# Patient Record
Sex: Female | Born: 1960
Health system: Southern US, Community
[De-identification: ages and names within clinical notes are randomized; demographics above are authoritative.]

## PROBLEM LIST (undated history)

## (undated) DIAGNOSIS — T7840XA Allergy, unspecified, initial encounter: Secondary | ICD-10-CM

## (undated) DIAGNOSIS — N950 Postmenopausal bleeding: Secondary | ICD-10-CM

## (undated) DIAGNOSIS — E785 Hyperlipidemia, unspecified: Secondary | ICD-10-CM

## (undated) DIAGNOSIS — G43109 Migraine with aura, not intractable, without status migrainosus: Secondary | ICD-10-CM

## (undated) DIAGNOSIS — F32A Depression, unspecified: Secondary | ICD-10-CM

## (undated) HISTORY — PX: INDUCED ABORTION: SHX677

## (undated) HISTORY — DX: Postmenopausal bleeding: N95.0

## (undated) HISTORY — PX: BREAST BIOPSY: SHX20

## (undated) HISTORY — PX: COLONOSCOPY: SHX174

## (undated) HISTORY — DX: Depression, unspecified: F32.A

## (undated) HISTORY — DX: Hyperlipidemia, unspecified: E78.5

## (undated) HISTORY — DX: Migraine with aura, not intractable, without status migrainosus: G43.109

## (undated) HISTORY — DX: Allergy, unspecified, initial encounter: T78.40XA

## (undated) SURGERY — Surgical Case
Anesthesia: *Unknown

---

## 2000-07-01 ENCOUNTER — Other Ambulatory Visit: Admission: RE | Admit: 2000-07-01 | Discharge: 2000-07-01 | Payer: Self-pay | Admitting: *Deleted

## 2001-11-30 ENCOUNTER — Other Ambulatory Visit: Admission: RE | Admit: 2001-11-30 | Discharge: 2001-11-30 | Payer: Self-pay | Admitting: *Deleted

## 2001-11-30 ENCOUNTER — Ambulatory Visit (HOSPITAL_COMMUNITY): Admission: RE | Admit: 2001-11-30 | Discharge: 2001-11-30 | Payer: Self-pay | Admitting: *Deleted

## 2003-11-15 ENCOUNTER — Ambulatory Visit (HOSPITAL_COMMUNITY): Admission: RE | Admit: 2003-11-15 | Discharge: 2003-11-15 | Payer: Self-pay | Admitting: *Deleted

## 2005-02-06 ENCOUNTER — Ambulatory Visit (HOSPITAL_COMMUNITY): Admission: RE | Admit: 2005-02-06 | Discharge: 2005-02-06 | Payer: Self-pay | Admitting: *Deleted

## 2016-03-02 ENCOUNTER — Ambulatory Visit (INDEPENDENT_AMBULATORY_CARE_PROVIDER_SITE_OTHER): Payer: BLUE CROSS/BLUE SHIELD | Admitting: Obstetrics and Gynecology

## 2016-03-02 ENCOUNTER — Encounter: Payer: Self-pay | Admitting: Obstetrics and Gynecology

## 2016-03-02 VITALS — BP 110/70 | HR 64 | Resp 14 | Ht 62.5 in | Wt 117.0 lb

## 2016-03-02 DIAGNOSIS — N950 Postmenopausal bleeding: Secondary | ICD-10-CM | POA: Diagnosis not present

## 2016-03-02 DIAGNOSIS — G43109 Migraine with aura, not intractable, without status migrainosus: Secondary | ICD-10-CM | POA: Insufficient documentation

## 2016-03-02 NOTE — Progress Notes (Signed)
Patient ID: Michelle Watson, female   DOB: Oct 10, 1961, 55 y.o.   MRN: [id number] 55 y.o. G1P0010 MarriedCaucasianF here c/o PMB about two months ago.  Menopause in 2013. 8 months ago she noted one drop of blood in her underwear, not sure where it was from. She placed a tampon in, no blood on the tampon. Then 2 months ago she then had one day of definite vaginal spotting. No pain. No intercourse prior to the bleeding. Normal pap in the fall of 2016. No h/o abnormal paps.  The day prior to bleeding she had breast tenderness.     No LMP recorded. Patient is postmenopausal.          Sexually active: Yes.    The current method of family planning is post menopausal status.    Exercising: Yes.    walking Smoker:  no  Health Maintenance: Pap:  06/2015 WNL(w/ PCP) per Patient History of abnormal Pap:  no MMG:  06/2015 WNL per patient [facility])  Colonoscopy:  08/2012 WNL per patient  BMD:   Never TDaP:  2013 Gardasil: N/A   reports that she has never smoked. She has never used smokeless tobacco. She reports that she drinks about 1.8 oz of alcohol per week. She reports that she does not use illicit drugs. She is a Psychologist, educational, Orthoptist MD  Past Medical History  Diagnosis Date  . Abnormal uterine bleeding   . Migraine     History reviewed. No pertinent past surgical history.  Current Outpatient Prescriptions  Medication Sig Dispense Refill  . triamcinolone (NASACORT ALLERGY 24HR) 55 MCG/ACT AERO nasal inhaler Place 2 sprays into the nose daily.     No current facility-administered medications for this visit.    Family History  Problem Relation Age of Onset  . Hypertension Mother   . Osteoporosis Mother   . Dementia Mother   . Hypertension Father   . CVA Father   . Ovarian cancer Maternal Aunt     Review of Systems  Constitutional: Negative.   HENT: Negative.   Eyes: Negative.   Respiratory: Negative.   Cardiovascular: Negative.   Gastrointestinal: Negative.    Endocrine: Negative.   Genitourinary: Negative.   Musculoskeletal: Negative.   Skin: Negative.   Allergic/Immunologic: Negative.   Neurological: Negative.   Psychiatric/Behavioral: Negative.     Exam:   BP 110/70 mmHg  Pulse 64  Resp 14  Ht 5' 2.5" (1.588 m)  Wt 117 lb (53.071 kg)  BMI 21.05 kg/m2  Weight change: @WEIGHTCHANGE @ Height:   Height: 5' 2.5" (158.8 cm)  Ht Readings from Last 3 Encounters:  03/02/16 5' 2.5" (1.588 m)    General appearance: alert, cooperative and appears stated age Head: Normocephalic, without obvious abnormality, atraumatic Neck: no adenopathy, supple, symmetrical, trachea midline and thyroid normal to inspection and palpation Lungs: clear to auscultation bilaterally Heart: regular rate and rhythm Abdomen: soft, non-tender; bowel sounds normal; no masses,  no organomegaly Extremities: extremities normal, atraumatic, no cyanosis or edema Skin: Skin color, texture, turgor normal. No rashes or lesions Lymph nodes: Cervical, supraclavicular, and axillary nodes normal. No abnormal inguinal nodes palpated Neurologic: Grossly normal   Pelvic: External genitalia:  no lesions              Urethra:  normal appearing urethra with no masses, tenderness or lesions              Bartholins and Skenes: normal  Vagina: normal appearing vagina with normal color and discharge, no lesions              Cervix: no lesions and mild stenosis               Bimanual Exam:  Uterus:  normal size, contour, position, consistency, mobility, non-tender and anteverted              Adnexa: no mass, fullness, tenderness               Rectovaginal: Confirms               Anus:  normal sphincter tone, no lesions  The risks of endometrial biopsy were reviewed and a consent was obtained.  A speculum was placed in the vagina and the cervix was cleansed with betadine. A tenaculum was placed on the cervix. The cervix was mildly stenotic, it was dilated with the 3 mm  dilator and the mini-pipelle was placed into the endometrial cavity. The uterus sounded to 7 cm. The endometrial biopsy was performed, minimal tissue was obtained (definately in the cavity). The tenaculum and speculum were removed. There were no complications.    Chaperone was present for exam.  A:  Postmenopausal bleeding, normal exam  P:   Endometrial biopsy done  Return for an ultrasound and possible sonohysterogram

## 2016-03-02 NOTE — Patient Instructions (Signed)
Postmenopausal Bleeding Postmenopausal bleeding is any bleeding a woman has after she has entered into menopause. Menopause is the end of a woman's fertile years. After menopause, a woman no longer ovulates or has menstrual periods.  Postmenopausal bleeding can be caused by various things. Any type of postmenopausal bleeding, even if it appears to be a typical menstrual period, is concerning. This should be evaluated by your health care provider. Any treatment will depend on the cause of the bleeding. HOME CARE INSTRUCTIONS Monitor your condition for any changes. The following actions may help to alleviate any discomfort you are experiencing:  Avoid the use of tampons and douches as directed by your health care provider.  Change your pads frequently.  Get regular pelvic exams and Pap tests.  Keep all follow-up appointments for diagnostic tests as directed by your health care provider. SEEK MEDICAL CARE IF:   Your bleeding lasts more than 1 week.  You have abdominal pain.  You have bleeding with sexual intercourse. SEEK IMMEDIATE MEDICAL CARE IF:   You have a fever, chills, headache, dizziness, muscle aches, and bleeding.  You have severe pain with bleeding.  You are passing blood clots.  You have bleeding and need more than 1 pad an hour.  You feel faint. MAKE SURE YOU:  Understand these instructions.  Will watch your condition.  Will get help right away if you are not doing well or get worse.   This information is not intended to replace advice given to you by your health care provider. Make sure you discuss any questions you have with your health care provider.   Document Released: 01/20/2006 Document Revised: 08/02/2013 Document Reviewed: 05/11/2013 Elsevier Interactive Patient Education 2016 [place].  Endometrial Biopsy Post-procedure Instructions . Cramping is common.  You may take Ibuprofen, Aleve, or Tylenol for the cramping.  This should resolve within 24  hours.   . You may have a small amount of spotting.  You should wear a mini pad for the next few days. . You may have intercourse in 24 hours. . You need to call the office if you have any pelvic pain, fever, heavy bleeding, or foul smelling vaginal discharge. . Shower or bathe as normal . You will be notified within one week of your biopsy results or we will discuss your results at your follow-up appointment if needed. Marland Kitchen

## 2016-03-03 ENCOUNTER — Telehealth: Payer: Self-pay | Admitting: Obstetrics and Gynecology

## 2016-03-03 NOTE — Telephone Encounter (Signed)
Spoke with pt regarding benefit for sonohysterogram. Patient understood and agreeable. Patient ready to schedule. Patient scheduled 03/09/16 with Dr Talbert Nan. Pt aware of arrival date and time. Pt aware of 72 hours cancellation policy with [id number] fee. No further questions. Ok to close

## 2016-03-10 ENCOUNTER — Other Ambulatory Visit: Payer: BLUE CROSS/BLUE SHIELD | Admitting: Obstetrics and Gynecology

## 2016-03-10 ENCOUNTER — Other Ambulatory Visit: Payer: BLUE CROSS/BLUE SHIELD

## 2016-03-12 ENCOUNTER — Other Ambulatory Visit: Payer: BLUE CROSS/BLUE SHIELD | Admitting: Obstetrics and Gynecology

## 2016-03-12 ENCOUNTER — Other Ambulatory Visit: Payer: BLUE CROSS/BLUE SHIELD

## 2016-03-12 ENCOUNTER — Ambulatory Visit (INDEPENDENT_AMBULATORY_CARE_PROVIDER_SITE_OTHER): Payer: BLUE CROSS/BLUE SHIELD | Admitting: Obstetrics and Gynecology

## 2016-03-12 ENCOUNTER — Encounter: Payer: Self-pay | Admitting: Obstetrics and Gynecology

## 2016-03-12 ENCOUNTER — Other Ambulatory Visit: Payer: Self-pay | Admitting: Obstetrics and Gynecology

## 2016-03-12 ENCOUNTER — Ambulatory Visit (INDEPENDENT_AMBULATORY_CARE_PROVIDER_SITE_OTHER): Payer: BLUE CROSS/BLUE SHIELD

## 2016-03-12 VITALS — BP 118/70 | HR 60 | Resp 14 | Wt 120.0 lb

## 2016-03-12 DIAGNOSIS — N950 Postmenopausal bleeding: Secondary | ICD-10-CM

## 2016-03-12 DIAGNOSIS — N841 Polyp of cervix uteri: Secondary | ICD-10-CM | POA: Diagnosis not present

## 2016-03-12 DIAGNOSIS — N839 Noninflammatory disorder of ovary, fallopian tube and broad ligament, unspecified: Secondary | ICD-10-CM | POA: Diagnosis not present

## 2016-03-12 DIAGNOSIS — N838 Other noninflammatory disorders of ovary, fallopian tube and broad ligament: Secondary | ICD-10-CM

## 2016-03-12 NOTE — Progress Notes (Signed)
Patient ID: Michelle Watson, female   DOB: 1961/01/30, 55 y.o.   MRN: [id number] GYNECOLOGY  VISIT   HPI: 55 y.o.   Married  Caucasian  female   G1P0010 with No LMP recorded. Patient is postmenopausal.   here for a sonohysterogram for PMP bleeding. Atrophic endometrium on biopsy.  GYNECOLOGIC HISTORY: No LMP recorded. Patient is postmenopausal. Contraception:postmenopause Menopausal hormone therapy: none        OB History    Gravida Para Term Preterm AB TAB SAB Ectopic Multiple Living   1    1              Patient Active Problem List   Diagnosis Date Noted  . Postmenopausal bleeding 03/02/2016  . Migraine with aura     Past Medical History  Diagnosis Date  . Postmenopausal bleeding   . Migraine with aura     History reviewed. No pertinent past surgical history.  Current Outpatient Prescriptions  Medication Sig Dispense Refill  . triamcinolone (NASACORT ALLERGY 24HR) 55 MCG/ACT AERO nasal inhaler Place 2 sprays into the nose daily.     No current facility-administered medications for this visit.     ALLERGIES: Tequin  Family History  Problem Relation Age of Onset  . Hypertension Mother   . Osteoporosis Mother   . Dementia Mother   . Hypertension Father   . CVA Father   . Ovarian cancer Maternal Aunt     Social History   Social History  . Marital Status: Married    Spouse Name: N/A  . Number of Children: N/A  . Years of Education: N/A   Occupational History  . Not on file.   Social History Main Topics  . Smoking status: Never Smoker   . Smokeless tobacco: Never Used  . Alcohol Use: 1.8 oz/week    3 Standard drinks or equivalent per week  . Drug Use: No  . Sexual Activity:    Partners: Male   Other Topics Concern  . Not on file   Social History Narrative    Review of Systems  Constitutional: Negative.   HENT: Negative.   Eyes: Negative.   Respiratory: Negative.   Cardiovascular: Negative.   Gastrointestinal: Negative.   Genitourinary:  Negative.   Musculoskeletal: Negative.   Skin: Negative.   Neurological: Negative.   Endo/Heme/Allergies: Negative.   Psychiatric/Behavioral: Negative.     PHYSICAL EXAMINATION:    BP 118/70 mmHg  Pulse 60  Resp 14  Wt 120 lb (54.432 kg)    General appearance: alert, cooperative and appears stated age  Pelvic: External genitalia:  no lesions              Urethra:  normal appearing urethra with no masses, tenderness or lesions              Bartholins and Skenes: normal                 Vagina: normal appearing vagina with normal color and discharge, no lesions              Cervix: no lesions                Sonohysterogram The procedure and risks of the procedure were reviewed with the patient, consent form was signed. A speculum was placed in the vagina and the cervix was cleansed with betadine. The sonohysterogram catheter was inserted into the uterine cavity without difficulty. Saline was infused under direct observation with the ultrasound. No endometrial defects noted, but  a large intracavitary defect was seen in the cervix.The catheter was removed.    Chaperone was present for exam.   Ultrasound images were reviewed with the patient. She has a 4 cm complex right ovarian cyst, looks c/w a dermoid. No free fluid. Left ovary with 1 cm follicle.   ASSESSMENT Postmenopausal bleeding, large intracervical polyp seen on sonohysterogram. Inactive endometrial biopsy Complex right ovarian cyst, most c/w a dermoid Family history of ovarian cancer in a Maternal Aunt    PLAN CA 125 done Discussed the ovarian cyst appears benign, but dermoids can have malignant transformation, also discussed risk of torsion or rupture Recommended hysteroscopy, polypectomy, D&C, laparoscopy with RSO, LS, possible LSO Discussed the risks, including infection, bleeding, damage to nearby organs or vessels.  Will schedule surgery. She will let me know if she wants to keep or remove her left ovary.    An  After Visit Summary was printed and given to the patient.  20 minutes face to face time of which over 50% was spent in counseling.

## 2016-03-13 ENCOUNTER — Telehealth: Payer: Self-pay | Admitting: Emergency Medicine

## 2016-03-13 LAB — CA 125: CA 125: 13 U/mL (ref <35)

## 2016-03-13 NOTE — Telephone Encounter (Signed)
-----   Message from Salvadore Dom, MD sent at 03/13/2016  1:32 PM EDT ----- Please let Dr Rocky Link know that her CA 125 was normal.

## 2016-03-13 NOTE — Telephone Encounter (Signed)
Dr. Rocky Link notified of normal CA-125 level. Will follow up as needed. Routing to provider for final review. Patient agreeable to disposition. Will close encounter.

## 2016-04-15 ENCOUNTER — Ambulatory Visit (INDEPENDENT_AMBULATORY_CARE_PROVIDER_SITE_OTHER): Payer: BLUE CROSS/BLUE SHIELD | Admitting: Obstetrics and Gynecology

## 2016-04-15 ENCOUNTER — Encounter: Payer: Self-pay | Admitting: *Deleted

## 2016-04-15 ENCOUNTER — Encounter: Payer: Self-pay | Admitting: Obstetrics and Gynecology

## 2016-04-15 VITALS — BP 90/60 | HR 64 | Resp 14 | Ht 62.5 in | Wt 120.0 lb

## 2016-04-15 DIAGNOSIS — N83201 Unspecified ovarian cyst, right side: Secondary | ICD-10-CM

## 2016-04-15 DIAGNOSIS — N950 Postmenopausal bleeding: Secondary | ICD-10-CM

## 2016-04-15 MED ORDER — OXYCODONE-ACETAMINOPHEN 5-325 MG PO TABS: 1.0000 | ORAL_TABLET | ORAL | Status: DC | PRN

## 2016-04-15 MED ORDER — IBUPROFEN 800 MG PO TABS: 800.0000 mg | ORAL_TABLET | Freq: Three times a day (TID) | ORAL | Status: DC | PRN

## 2016-04-15 NOTE — Progress Notes (Signed)
GYNECOLOGY  VISIT   HPI: 55 y.o.   Married Caucasian female   G1P0010 with No LMP recorded. Patient is postmenopausal.   here for   Pre op for 05/06/16 LAPAROSCOPY OPERATIVE N/A General RNFA NEEDED RIGHT SALPINGO OOPHORECTOMY POSSIBLE LEFT SALPINGO OOPHORECTOMY Right General  DILATATION & CURETTAGE/HYSTEROSCOPY WITH MYOSURE The patient presented with PMP bleeding, biopsy was inactive, sonohysterogram with large endocervical polyp (not seen on exam), she was also noted to have a 4 cm complex right ovarian cyst C/W a dermoid. CA 125 was 13. She does have a maternal aunt with ovarian cancer. The patient desires BSO.      GYNECOLOGIC HISTORY: No LMP recorded. Patient is postmenopausal. Contraception:Post Menopausal  Menopausal hormone therapy: None        OB History    Gravida Para Term Preterm AB TAB SAB Ectopic Multiple Living   1    1              Patient Active Problem List   Diagnosis Date Noted  . Postmenopausal bleeding 03/02/2016  . Migraine with aura     Past Medical History  Diagnosis Date  . Postmenopausal bleeding   . Migraine with aura     History reviewed. No pertinent past surgical history.  Current Outpatient Prescriptions  Medication Sig Dispense Refill  . naproxen (NAPROSYN) 250 MG tablet Take 500 mg by mouth as needed.    . triamcinolone (NASACORT ALLERGY 24HR) 55 MCG/ACT AERO nasal inhaler Place 2 sprays into the nose daily.     No current facility-administered medications for this visit.     ALLERGIES: Tequin  Family History  Problem Relation Age of Onset  . Hypertension Mother   . Osteoporosis Mother   . Dementia Mother   . Hypertension Father   . CVA Father   . Ovarian cancer Maternal Aunt     Social History   Social History  . Marital Status: Married    Spouse Name: N/A  . Number of Children: N/A  . Years of Education: N/A   Occupational History  . Not on file.   Social History Main Topics  . Smoking status: Never Smoker   .  Smokeless tobacco: Never Used  . Alcohol Use: 1.8 oz/week    3 Standard drinks or equivalent per week  . Drug Use: No  . Sexual Activity:    Partners: Male   Other Topics Concern  . Not on file   Social History Narrative    Review of Systems  Constitutional: Negative.   HENT: Negative.   Eyes: Negative.   Respiratory: Negative.   Cardiovascular: Negative.   Gastrointestinal: Negative.   Genitourinary:       Unscheduled bleeding  Musculoskeletal: Negative.   Skin: Negative.   Neurological: Negative.   Endo/Heme/Allergies: Negative.   Psychiatric/Behavioral: Negative.     PHYSICAL EXAMINATION:    BP 90/60 mmHg  Pulse 64  Resp 14  Ht 5' 2.5" (1.588 m)  Wt 120 lb (54.432 kg)  BMI 21.59 kg/m2    General appearance: alert, cooperative and appears stated age Neck: no adenopathy, supple, symmetrical, trachea midline and thyroid normal to inspection and palpation Heart: regular rate and rhythm Lungs: CTAB Abdomen: soft, non-tender; bowel sounds normal; no masses,  no organomegaly Lungs: CTAB Extremities: normal, atraumatic, no cyanosis Skin: normal color, texture and turgor, no rashes or lesions Lymph: normal cervical supraclavicular and inguinal nodes Neurologic: grossly normal     ASSESSMENT Postmenopausal bleeding, large endocervical polyp on sonohysterogram Complex  right ovarian cyst, c/w dermoid, normal CA 125    PLAN Plan: hysteroscopy, cervical polypectomy, dilation and curettage, laparoscopy, BSO. Reviewed risks, including: bleeding, infection, uterine perforation, fluid overload, need for further sugery, damage to bowel/bladder/vessels/ureters Scripts for percocet and ibuprofen given    An After Visit Summary was printed and given to the patient.

## 2016-04-15 NOTE — H&P (Signed)
Status: Signed       Expand All Collapse All   GYNECOLOGY VISIT  HPI: 55 y.o. Married Caucasian female  G1P0010 with No LMP recorded. Patient is postmenopausal.  here for Pre op for 05/06/16 LAPAROSCOPY OPERATIVE N/AGeneral RNFA NEEDED RIGHT SALPINGO OOPHORECTOMY POSSIBLE LEFT SALPINGO OOPHORECTOMYRightGeneral DILATATION & CURETTAGE/HYSTEROSCOPY WITH MYOSURE The patient presented with PMP bleeding, biopsy was inactive, sonohysterogram with large endocervical polyp (not seen on exam), she was also noted to have a 4 cm complex right ovarian cyst C/W a dermoid. CA 125 was 13. She does have a maternal aunt with ovarian cancer. The patient desires BSO.   GYNECOLOGIC HISTORY: No LMP recorded. Patient is postmenopausal. Contraception:Post Menopausal  Menopausal hormone therapy: None   OB History    Gravida Para Term Preterm AB TAB SAB Ectopic Multiple Living   1    1            Patient Active Problem List   Diagnosis Date Noted  . Postmenopausal bleeding 03/02/2016  . Migraine with aura     Past Medical History  Diagnosis Date  . Postmenopausal bleeding   . Migraine with aura     History reviewed. No pertinent past surgical history.  Current Outpatient Prescriptions  Medication Sig Dispense Refill  . naproxen (NAPROSYN) 250 MG tablet Take 500 mg by mouth as needed.    . triamcinolone (NASACORT ALLERGY 24HR) 55 MCG/ACT AERO nasal inhaler Place 2 sprays into the nose daily.     No current facility-administered medications for this visit.     ALLERGIES: Tequin  Family History  Problem Relation Age of Onset  . Hypertension Mother   . Osteoporosis Mother   . Dementia Mother   . Hypertension Father   . CVA Father   . Ovarian cancer Maternal Aunt     Social History   Social History  . Marital  Status: Married    Spouse Name: N/A  . Number of Children: N/A  . Years of Education: N/A   Occupational History  . Not on file.   Social History Main Topics  . Smoking status: Never Smoker   . Smokeless tobacco: Never Used  . Alcohol Use: 1.8 oz/week    3 Standard drinks or equivalent per week  . Drug Use: No  . Sexual Activity:    Partners: Male   Other Topics Concern  . Not on file   Social History Narrative    Review of Systems  Constitutional: Negative.  HENT: Negative.  Eyes: Negative.  Respiratory: Negative.  Cardiovascular: Negative.  Gastrointestinal: Negative.  Genitourinary:   Unscheduled bleeding  Musculoskeletal: Negative.  Skin: Negative.  Neurological: Negative.  Endo/Heme/Allergies: Negative.  Psychiatric/Behavioral: Negative.    PHYSICAL EXAMINATION:   BP 90/60 mmHg  Pulse 64  Resp 14  Ht 5' 2.5" (1.588 m)  Wt 120 lb (54.432 kg)  BMI 21.59 kg/m2  General appearance: alert, cooperative and appears stated age Neck: no adenopathy, supple, symmetrical, trachea midline and thyroid normal to inspection and palpation Heart: regular rate and rhythm Lungs: CTAB Abdomen: soft, non-tender; bowel sounds normal; no masses, no organomegaly Lungs: CTAB Extremities: normal, atraumatic, no cyanosis Skin: normal color, texture and turgor, no rashes or lesions Lymph: normal cervical supraclavicular and inguinal nodes Neurologic: grossly normal     ASSESSMENT Postmenopausal bleeding, large endocervical polyp on sonohysterogram Complex right ovarian cyst, c/w dermoid, normal CA 125   PLAN Plan: hysteroscopy, cervical polypectomy, dilation and curettage, laparoscopy, BSO. Reviewed risks, including: bleeding, infection, uterine  perforation, fluid overload, need for further sugery, damage to bowel/bladder/vessels/ureters Scripts for percocet and ibuprofen given

## 2016-04-21 NOTE — Patient Instructions (Addendum)
Your procedure is scheduled on:  Wednesday, May 06, 2016  Enter through the [employer] [facility] at: 7:00 AM  Pick up the phone at the desk and dial [phone].  Call this number if you have problems the morning of surgery: [phone].  Remember: Do NOT eat food or drink after:  Midnight Tuesday, May 05, 2016  Take these medicines the morning of surgery with a SIP OF WATER: None  Do NOT wear jewelry (body piercing), metal hair clips/bobby pins, make-up, or nail polish. Do NOT wear lotions, powders, or perfumes.  You may wear deodorant. Do NOT shave for 48 hours prior to surgery. Do NOT bring valuables to the hospital. Contacts, dentures, or bridgework may not be worn into surgery.  Have a responsible adult drive you home and stay with you for 24 hours after your procedure

## 2016-04-22 ENCOUNTER — Encounter (HOSPITAL_COMMUNITY): Payer: Self-pay

## 2016-04-22 ENCOUNTER — Encounter (HOSPITAL_COMMUNITY)
Admission: RE | Admit: 2016-04-22 | Discharge: 2016-04-22 | Disposition: A | Discharge status: Home / Self Care | Payer: BLUE CROSS/BLUE SHIELD | Source: Ambulatory Visit | Attending: Obstetrics and Gynecology | Admitting: Obstetrics and Gynecology

## 2016-04-22 DIAGNOSIS — Z01812 Encounter for preprocedural laboratory examination: Secondary | ICD-10-CM | POA: Insufficient documentation

## 2016-04-22 LAB — CBC
HCT: 38.3 % (ref 36.0–46.0)
Hemoglobin: 12.6 g/dL (ref 12.0–15.0)
MCH: 31.1 pg (ref 26.0–34.0)
MCHC: 32.9 g/dL (ref 30.0–36.0)
MCV: 94.6 fL (ref 78.0–100.0)
PLATELETS: 190 10*3/uL (ref 150–400)
RBC: 4.05 MIL/uL (ref 3.87–5.11)
RDW: 13.3 % (ref 11.5–15.5)
WBC: 7.3 10*3/uL (ref 4.0–10.5)

## 2016-04-22 LAB — COMPREHENSIVE METABOLIC PANEL
ALBUMIN: 4.5 g/dL (ref 3.5–5.0)
ALT: 14 U/L (ref 14–54)
AST: 18 U/L (ref 15–41)
Alkaline Phosphatase: 41 U/L (ref 38–126)
Anion gap: 6 (ref 5–15)
BUN: 33 mg/dL — AB (ref 6–20)
CHLORIDE: 101 mmol/L (ref 101–111)
CO2: 29 mmol/L (ref 22–32)
Calcium: 9.1 mg/dL (ref 8.9–10.3)
Creatinine, Ser: 0.68 mg/dL (ref 0.44–1.00)
GFR calc Af Amer: >60 mL/min (ref >60)
GLUCOSE: 106 mg/dL — AB (ref 65–99)
POTASSIUM: 4 mmol/L (ref 3.5–5.1)
SODIUM: 136 mmol/L (ref 135–145)
Total Bilirubin: 0.2 mg/dL — ABNORMAL LOW (ref 0.3–1.2)
Total Protein: 7.4 g/dL (ref 6.5–8.1)

## 2016-04-22 NOTE — Pre-Procedure Instructions (Signed)
Lamont Snowball made aware of Michelle Watson's elevated BUN level, she stated she will notify Dr. Quincy Simmonds.

## 2016-05-05 NOTE — Anesthesia Preprocedure Evaluation (Addendum)
Anesthesia Evaluation  Patient identified by MRN, date of birth, ID band Patient awake    Reviewed: Allergy & Precautions, NPO status , Patient's Chart, lab work & pertinent test results  Airway Mallampati: II  TM Distance: >3 FB Neck ROM: Full    Dental  (+) Teeth Intact, Dental Advisory Given   Pulmonary neg pulmonary ROS,    Pulmonary exam normal breath sounds clear to auscultation       Cardiovascular negative cardio ROS Normal cardiovascular exam Rhythm:Regular Rate:Normal     Neuro/Psych  Headaches, negative psych ROS   GI/Hepatic negative GI ROS, Neg liver ROS,   Endo/Other  negative endocrine ROS  Renal/GU negative Renal ROS     Musculoskeletal negative musculoskeletal ROS (+)   Abdominal   Peds  Hematology negative hematology ROS (+)   Anesthesia Other Findings Day of surgery medications reviewed with the patient.  Reproductive/Obstetrics PMB                             Anesthesia Physical Anesthesia Plan  ASA: II  Anesthesia Plan: General   Post-op Pain Management:    Induction: Intravenous  Airway Management Planned: Oral ETT  Additional Equipment:   Intra-op Plan:   Post-operative Plan: Extubation in OR  Informed Consent: I have reviewed the patients History and Physical, chart, labs and discussed the procedure including the risks, benefits and alternatives for the proposed anesthesia with the patient or authorized representative who has indicated his/her understanding and acceptance.   Dental advisory given  Plan Discussed with: CRNA  Anesthesia Plan Comments: (Risks/benefits of general anesthesia discussed with patient including risk of damage to teeth, lips, gum, and tongue, nausea/vomiting, allergic reactions to medications, and the possibility of heart attack, stroke and death.  All patient questions answered.  Patient wishes to proceed.)         Anesthesia Quick Evaluation

## 2016-05-06 ENCOUNTER — Ambulatory Visit (HOSPITAL_COMMUNITY): Payer: BLUE CROSS/BLUE SHIELD | Admitting: Anesthesiology

## 2016-05-06 ENCOUNTER — Encounter (HOSPITAL_COMMUNITY): Payer: Self-pay | Admitting: *Deleted

## 2016-05-06 ENCOUNTER — Ambulatory Visit (HOSPITAL_COMMUNITY)
Admission: RE | Admit: 2016-05-06 | Discharge: 2016-05-06 | Disposition: A | Discharge status: Home / Self Care | Payer: BLUE CROSS/BLUE SHIELD | Source: Ambulatory Visit | Attending: Obstetrics and Gynecology | Admitting: Obstetrics and Gynecology

## 2016-05-06 ENCOUNTER — Encounter (HOSPITAL_COMMUNITY)
Admission: RE | Disposition: A | Discharge status: Home / Self Care | Payer: Self-pay | Source: Ambulatory Visit | Attending: Obstetrics and Gynecology

## 2016-05-06 DIAGNOSIS — N841 Polyp of cervix uteri: Secondary | ICD-10-CM | POA: Insufficient documentation

## 2016-05-06 DIAGNOSIS — N83201 Unspecified ovarian cyst, right side: Secondary | ICD-10-CM | POA: Diagnosis not present

## 2016-05-06 DIAGNOSIS — N840 Polyp of corpus uteri: Secondary | ICD-10-CM | POA: Insufficient documentation

## 2016-05-06 DIAGNOSIS — Z823 Family history of stroke: Secondary | ICD-10-CM | POA: Insufficient documentation

## 2016-05-06 DIAGNOSIS — D270 Benign neoplasm of right ovary: Secondary | ICD-10-CM | POA: Insufficient documentation

## 2016-05-06 DIAGNOSIS — N950 Postmenopausal bleeding: Secondary | ICD-10-CM | POA: Diagnosis not present

## 2016-05-06 DIAGNOSIS — Z8041 Family history of malignant neoplasm of ovary: Secondary | ICD-10-CM | POA: Diagnosis not present

## 2016-05-06 HISTORY — PX: LAPAROSCOPY: SHX197

## 2016-05-06 HISTORY — PX: SALPINGOOPHORECTOMY: SHX82

## 2016-05-06 HISTORY — PX: DILATATION & CURETTAGE/HYSTEROSCOPY WITH MYOSURE: SHX6511

## 2016-05-06 SURGERY — LAPAROSCOPY OPERATIVE
Anesthesia: General

## 2016-05-06 MED ORDER — MIDAZOLAM HCL 5 MG/5ML IJ SOLN
INTRAMUSCULAR | Status: DC | PRN
  Administered 2016-05-06: 2 mg via INTRAVENOUS

## 2016-05-06 MED ORDER — KETOROLAC TROMETHAMINE 30 MG/ML IJ SOLN
INTRAMUSCULAR | Status: DC | PRN
  Administered 2016-05-06: 30 mg via INTRAVENOUS

## 2016-05-06 MED ORDER — PROMETHAZINE HCL 25 MG/ML IJ SOLN: 6.2500 mg | INTRAMUSCULAR | Status: DC | PRN

## 2016-05-06 MED ORDER — PROPOFOL 10 MG/ML IV BOLUS
INTRAVENOUS | Status: DC | PRN
  Administered 2016-05-06: 60 mg via INTRAVENOUS

## 2016-05-06 MED ORDER — ONDANSETRON HCL 4 MG/2ML IJ SOLN
INTRAMUSCULAR | Status: DC | PRN
  Administered 2016-05-06: 4 mg via INTRAVENOUS

## 2016-05-06 MED ORDER — FENTANYL CITRATE (PF) 100 MCG/2ML IJ SOLN: 25.0000 ug | INTRAMUSCULAR | Status: DC | PRN

## 2016-05-06 MED ORDER — LACTATED RINGERS IV SOLN
INTRAVENOUS | Status: DC
  Administered 2016-05-06 (×2): via INTRAVENOUS

## 2016-05-06 MED ORDER — SODIUM CHLORIDE 0.9 % IR SOLN
Status: DC | PRN
  Administered 2016-05-06: 3000 mL

## 2016-05-06 MED ORDER — DEXAMETHASONE SODIUM PHOSPHATE 4 MG/ML IJ SOLN
INTRAMUSCULAR | Status: DC | PRN
  Administered 2016-05-06: 4 mg via INTRAVENOUS

## 2016-05-06 MED ORDER — FENTANYL CITRATE (PF) 250 MCG/5ML IJ SOLN
INTRAMUSCULAR | Status: AC
  Filled 2016-05-06: qty 5

## 2016-05-06 MED ORDER — BUPIVACAINE HCL (PF) 0.25 % IJ SOLN
INTRAMUSCULAR | Status: DC | PRN
  Administered 2016-05-06: 8 mL

## 2016-05-06 MED ORDER — SUGAMMADEX SODIUM 200 MG/2ML IV SOLN
INTRAVENOUS | Status: DC | PRN
  Administered 2016-05-06: 109.2 mg via INTRAVENOUS

## 2016-05-06 MED ORDER — LIDOCAINE HCL (PF) 1 % IJ SOLN
INTRAMUSCULAR | Status: AC
  Filled 2016-05-06: qty 5

## 2016-05-06 MED ORDER — LIDOCAINE HCL (CARDIAC) 20 MG/ML IV SOLN
INTRAVENOUS | Status: DC | PRN
  Administered 2016-05-06: 50 mg via INTRAVENOUS

## 2016-05-06 MED ORDER — FENTANYL CITRATE (PF) 100 MCG/2ML IJ SOLN
INTRAMUSCULAR | Status: DC | PRN
  Administered 2016-05-06: 50 ug via INTRAVENOUS
  Administered 2016-05-06: 100 ug via INTRAVENOUS

## 2016-05-06 MED ORDER — GLYCOPYRROLATE 0.2 MG/ML IJ SOLN
INTRAMUSCULAR | Status: AC
  Filled 2016-05-06: qty 3

## 2016-05-06 MED ORDER — NEOSTIGMINE METHYLSULFATE 10 MG/10ML IV SOLN
INTRAVENOUS | Status: AC
  Filled 2016-05-06: qty 1

## 2016-05-06 MED ORDER — PROPOFOL 10 MG/ML IV BOLUS
INTRAVENOUS | Status: AC
  Filled 2016-05-06: qty 20

## 2016-05-06 MED ORDER — ONDANSETRON HCL 4 MG/2ML IJ SOLN
INTRAMUSCULAR | Status: AC
  Filled 2016-05-06: qty 2

## 2016-05-06 MED ORDER — ROCURONIUM BROMIDE 100 MG/10ML IV SOLN
INTRAVENOUS | Status: DC | PRN
  Administered 2016-05-06: 40 mg via INTRAVENOUS
  Administered 2016-05-06: 10 mg via INTRAVENOUS

## 2016-05-06 MED ORDER — SCOPOLAMINE 1 MG/3DAYS TD PT72
1.0000 | MEDICATED_PATCH | Freq: Once | TRANSDERMAL | Status: DC
Start: 2016-05-06 — End: 2016-05-06
  Administered 2016-05-06: 1.5 mg via TRANSDERMAL

## 2016-05-06 MED ORDER — VASOPRESSIN 20 UNIT/ML IV SOLN
INTRAVENOUS | Status: AC
  Filled 2016-05-06: qty 1

## 2016-05-06 MED ORDER — HEPARIN SODIUM (PORCINE) 5000 UNIT/ML IJ SOLN
INTRAMUSCULAR | Status: DC | PRN
  Administered 2016-05-06: 5000 [IU]

## 2016-05-06 MED ORDER — HEPARIN SODIUM (PORCINE) 5000 UNIT/ML IJ SOLN
INTRAMUSCULAR | Status: AC
  Filled 2016-05-06: qty 1

## 2016-05-06 MED ORDER — LACTATED RINGERS IV SOLN: INTRAVENOUS | Status: DC

## 2016-05-06 MED ORDER — LACTATED RINGERS IR SOLN
Status: DC | PRN
  Administered 2016-05-06: 3000 mL

## 2016-05-06 MED ORDER — SODIUM CHLORIDE 0.9 % IJ SOLN
INTRAMUSCULAR | Status: AC
  Filled 2016-05-06: qty 100

## 2016-05-06 MED ORDER — BUPIVACAINE HCL (PF) 0.25 % IJ SOLN
INTRAMUSCULAR | Status: AC
  Filled 2016-05-06: qty 30

## 2016-05-06 MED ORDER — KETOROLAC TROMETHAMINE 30 MG/ML IJ SOLN
INTRAMUSCULAR | Status: AC
  Filled 2016-05-06: qty 1

## 2016-05-06 MED ORDER — SCOPOLAMINE 1 MG/3DAYS TD PT72
MEDICATED_PATCH | TRANSDERMAL | Status: AC
  Administered 2016-05-06: 1.5 mg via TRANSDERMAL
  Filled 2016-05-06: qty 1

## 2016-05-06 MED ORDER — MIDAZOLAM HCL 2 MG/2ML IJ SOLN
INTRAMUSCULAR | Status: AC
  Filled 2016-05-06: qty 2

## 2016-05-06 MED ORDER — DEXAMETHASONE SODIUM PHOSPHATE 4 MG/ML IJ SOLN
INTRAMUSCULAR | Status: AC
  Filled 2016-05-06: qty 1

## 2016-05-06 SURGICAL SUPPLY — 49 items
APL SRG 38 LTWT LNG FL B (MISCELLANEOUS)
APPLICATOR ARISTA FLEXITIP XL (MISCELLANEOUS) IMPLANT
BAG SPEC RTRVL LRG 6X4 10 (ENDOMECHANICALS) ×2
CABLE HIGH FREQUENCY MONO STRZ (ELECTRODE) IMPLANT
CANISTER SUCT 3000ML (MISCELLANEOUS) ×6 IMPLANT
CATH ROBINSON RED A/P 16FR (CATHETERS) ×2 IMPLANT
CLOTH BEACON ORANGE TIMEOUT ST (SAFETY) ×4 IMPLANT
CONTAINER PREFILL 10% NBF 60ML (FORM) ×8 IMPLANT
DEVICE MYOSURE LITE (MISCELLANEOUS) ×2 IMPLANT
DEVICE MYOSURE REACH (MISCELLANEOUS) IMPLANT
DRSG COVADERM PLUS 2X2 (GAUZE/BANDAGES/DRESSINGS) ×8 IMPLANT
DRSG OPSITE POSTOP 3X4 (GAUZE/BANDAGES/DRESSINGS) IMPLANT
DURAPREP 26ML APPLICATOR (WOUND CARE) ×4 IMPLANT
FILTER ARTHROSCOPY CONVERTOR (FILTER) ×4 IMPLANT
GLOVE BIO SURGEON STRL SZ 6.5 (GLOVE) ×6 IMPLANT
GLOVE BIO SURGEONS STRL SZ 6.5 (GLOVE) ×2
GLOVE BIOGEL PI IND STRL 7.0 (GLOVE) ×4 IMPLANT
GLOVE BIOGEL PI INDICATOR 7.0 (GLOVE) ×4
GOWN STRL REUS W/TWL LRG LVL3 (GOWN DISPOSABLE) ×8 IMPLANT
HEMOSTAT ARISTA ABSORB 3G PWDR (MISCELLANEOUS) IMPLANT
LIGASURE BLUNT 5MM 37CM (INSTRUMENTS) ×2 IMPLANT
LIQUID BAND (GAUZE/BANDAGES/DRESSINGS) ×2 IMPLANT
NEEDLE INSUFFLATION 120MM (ENDOMECHANICALS) ×4 IMPLANT
NS IRRIG 1000ML POUR BTL (IV SOLUTION) ×4 IMPLANT
PACK LAPAROSCOPY BASIN (CUSTOM PROCEDURE TRAY) ×4 IMPLANT
PACK VAGINAL MINOR WOMEN LF (CUSTOM PROCEDURE TRAY) ×4 IMPLANT
PAD OB MATERNITY 4.3X12.25 (PERSONAL CARE ITEMS) ×4 IMPLANT
PAD TRENDELENBURG POSITION (MISCELLANEOUS) ×4 IMPLANT
POUCH LAPAROSCOPIC INSTRUMENT (MISCELLANEOUS) ×4 IMPLANT
POUCH SPECIMEN RETRIEVAL 10MM (ENDOMECHANICALS) ×2 IMPLANT
SCISSORS LAP 5X35 DISP (ENDOMECHANICALS) IMPLANT
SEAL ROD LENS SCOPE MYOSURE (ABLATOR) ×4 IMPLANT
SET CYSTO W/LG BORE CLAMP LF (SET/KITS/TRAYS/PACK) IMPLANT
SET IRRIG TUBING LAPAROSCOPIC (IRRIGATION / IRRIGATOR) IMPLANT
SHEARS HARMONIC ACE PLUS 36CM (ENDOMECHANICALS) IMPLANT
SLEEVE XCEL OPT CAN 5 100 (ENDOMECHANICALS) ×4 IMPLANT
SUT VICRYL 0 UR6 27IN ABS (SUTURE) ×2 IMPLANT
SUT VICRYL 4-0 PS2 18IN ABS (SUTURE) ×4 IMPLANT
SYR 20CC LL (SYRINGE) IMPLANT
SYRINGE 60CC LL (MISCELLANEOUS) ×2 IMPLANT
SYSTEM CARTER THOMASON II (TROCAR) ×2 IMPLANT
TOWEL OR 17X24 6PK STRL BLUE (TOWEL DISPOSABLE) ×8 IMPLANT
TRAY FOLEY CATH SILVER 14FR (SET/KITS/TRAYS/PACK) ×4 IMPLANT
TROCAR XCEL DIL TIP R 11M (ENDOMECHANICALS) ×2 IMPLANT
TROCAR XCEL NON-BLD 5MMX100MML (ENDOMECHANICALS) ×4 IMPLANT
TUBING AQUILEX INFLOW (TUBING) ×4 IMPLANT
TUBING AQUILEX OUTFLOW (TUBING) ×4 IMPLANT
WARMER LAPAROSCOPE (MISCELLANEOUS) ×4 IMPLANT
WATER STERILE IRR 1000ML POUR (IV SOLUTION) ×2 IMPLANT

## 2016-05-06 NOTE — Transfer of Care (Signed)
Immediate Anesthesia Transfer of Care Note  Patient: Michelle Watson  Procedure(s) Performed: Procedure(s) with comments: LAPAROSCOPY OPERATIVE (N/A) - RNFA NEEDED  Bilateral Salpingo oophorectomy (Bilateral) DILATATION & CURETTAGE/HYSTEROSCOPY WITH MYOSURE (N/A)  Patient Location: PACU  Anesthesia Type:General  Level of Consciousness: awake, alert  and oriented  Airway & Oxygen Therapy: Patient Spontanous Breathing and Patient connected to nasal cannula oxygen  Post-op Assessment: Report given to RN and Post -op Vital signs reviewed and stable  Post vital signs: Reviewed and stable  Last Vitals:  Filed Vitals:   05/06/16 0726  BP: 112/70  Pulse: 59  Temp: 37 C  Resp: 16    Last Pain: There were no vitals filed for this visit.    Patients Stated Pain Goal: 4 ([id number])  Complications: No apparent anesthesia complications

## 2016-05-06 NOTE — Anesthesia Postprocedure Evaluation (Signed)
Anesthesia Post Note  Patient: Michelle Watson  Procedure(s) Performed: Procedure(s) (LRB): LAPAROSCOPY OPERATIVE (N/A) Bilateral Salpingo oophorectomy (Bilateral) DILATATION & CURETTAGE/HYSTEROSCOPY WITH MYOSURE (N/A)  Patient location during evaluation: PACU Anesthesia Type: General Level of consciousness: awake and alert and oriented Pain management: pain level controlled Vital Signs Assessment: post-procedure vital signs reviewed and stable Respiratory status: spontaneous breathing, nonlabored ventilation and respiratory function stable Cardiovascular status: blood pressure returned to baseline and stable Postop Assessment: no signs of nausea or vomiting Anesthetic complications: no     Last Vitals:  Filed Vitals:   05/06/16 1115 05/06/16 1130  BP: 111/71 120/69  Pulse: 59   Temp:    Resp: 16 16    Last Pain: There were no vitals filed for this visit. Pain Goal: Patients Stated Pain Goal: 4 (05/06/16 0726)               Ismael Treptow A.

## 2016-05-06 NOTE — Anesthesia Procedure Notes (Signed)
Procedure Name: Intubation Date/Time: 05/06/2016 8:34 AM Performed by: Bufford Spikes Pre-anesthesia Checklist: Patient identified, Patient being monitored, Timeout performed, Emergency Drugs available and Suction available Patient Re-evaluated:Patient Re-evaluated prior to inductionOxygen Delivery Method: Circle system utilized Preoxygenation: Pre-oxygenation with 100% oxygen Intubation Type: IV induction Ventilation: Mask ventilation without difficulty Laryngoscope Size: Miller and 2 Grade View: Grade I Tube type: Oral Tube size: 7.0 mm Number of attempts: 1 Airway Equipment and Method: Stylet Placement Confirmation: ETT inserted through vocal cords under direct vision,  positive ETCO2 and breath sounds checked- equal and bilateral Secured at: 21 cm Tube secured with: Tape Dental Injury: Teeth and Oropharynx as per pre-operative assessment

## 2016-05-06 NOTE — Op Note (Signed)
Preoperative Diagnosis: postmenopausal bleeding, endocervical polyp, right ovarian cyst  Postoperative Diagnosis: postmenopausal bleeding, endocervical polyps, right ovarian dermoid  Procedure:  Laparoscopic bilateral salpingo-oophorectomy, hysteroscopy, polypectomy, dilation and curettage  Surgeon: Dr Sumner Boast  Assistant: Trellis Moment, RN, surgical assist  Anesthesia: General  EBL: 5 cc  Fluids: 1,100 cc  Urine output: 400 cc  Fluid deficit: 123XX123 cc  Complications: None  Indications for surgery: The patient is a 55 year old female, who presented with postmenopausal bleeding. Work up included an atrophic endometrial biopsy, a large endocervical polyp on sonohysterogram and a complex right ovarian cyst that looked c/w a dermoid. She had a normal CA 125.  The patient is aware of the risks and complications involved with the surgery and consent was obtained prior to the procedure.  Findings: EUA: normal sized uterus, 4 cm right adnexal mass. Laparoscopy revealed normal ureters bilaterally, normal uterus,normal left adnexa, normal right tube and an enlarged freely mobile right ovary. She had a normal liver edge and appendix. Hysteroscopy revealed atrophic endometrium, normal tubal ostia bilaterally. She had one large and one small endocervical polyp, both up inside of her cervix.   Procedure: The patient was taken to the operating room with an IV in placed. She was placed in the dorsal lithotomy position. General anesthesia was administered. She was prepped and draped in the usual sterile fashion for an abdominal, vaginal surgery. A single tooth tenaculum was placed on the anterior cervix, a hulka tenaculum was too angled for her cavity, so a sound was placed in the uterine cavity and taped to the single tooth tenaculum with steri strips for uterine manipulation. A foley catheter was placed.   The umbilicus was everted, injected with 0.25% marcaine and incised with a # 11 blade. 2 towel  clips were used to elevated the umbilicus and a veress needle was placed into the abdominal cavity. The abdominal cavity was insufflated with CO2, with normal intraabdominal pressures. After adequate pneumo-insufflation the veress needle was removed and the 5 mm laparoscope was placed into the abdominal cavity using the opti-view trocar. The patient was placed in trendelenburg and the abdominal pelvic cavity was inspected. 2 more trocars were placed. 1 in the left  lower quadrant approximately 3 cm medial to the anterior superior iliac spine, and one approximately 5 cm superior to the pubic symphysis in the midline. These areas were injected with 0.25% marcaine, incised with a #11 blades. A 5 mm trocar was inserted in the left lower quadrant and an 11 mm trocar in the midline. Both were inserted with direct visualization with the laparoscope. The abdominal pelvic cavity was again inspected. The ureters were identified bilaterally. Pelvic washings were obtained.   The left infundibulopelvic ligament was cauterized and incised with the ligasure device. The mesosalpinx and meso ovarium were cauterized and cut with the ligasure device. The uterine ovarian ligament and the attachment of the tube to the ovary were both cauterized and cut with the ligasure device. The same procedure was repeated on the right. The laparoscopic bag was placed through the 11 mm port and both adnexa were placed in the bag. The 11 mm trocar was removed and the bag was brought up to the skin incision. The adnexa were both removed through the bag without bag rupture. On removal of the right adnexa, the dermoid was opened, it had the typical sebaceous material and hair found in a dermoid tumor. No leakage occurred into the abdominal cavity.   Pressure was released and hemostasis was  excellent.   The [employer] device was used to place a stich of 0-Vicryl through the fascia of the 11 mm port. The abdominal cavity was desufflated and the  trocars were removed. The skin was closed with subcuticular stiches of 4-0 vicryl and dermabond was placed over the incisions.  Attention was then turned to the vaginal portion of the surgery. A weighted speculum was placed in the vagina and the uterine sound was removed. The single toothed tenaculum was kept in place. The cervix was dilated to a # 7 hagar dilator. The uterus was sounded to 7 cm. The myosure hysteroscope was inserted into the uterine cavity. With continuous infusion of normal saline, the uterine cavity was visualized with the above findings. The myosure light was used to remove both of the cervical polyps. The myosure was then removed. An endocervical curettage was performed. The cavity was then curetted with the small sharp curette. The cavity had the characteristically gritty texture at the end of the procedure. The curette and the single tooth tenaculum were removed. Oozing from the tenaculum site was stopped with pressure. The speculum was removed. The foley catheter was removed. The patients perineum was cleansed of betadine and she was taken out of the dorsal lithotomy position.   Upon awakening she was extubated and transferred to the recovery room in stable and awake condition.  The sponge and instrument count were correct.  There were no complications.

## 2016-05-06 NOTE — Interval H&P Note (Signed)
History and Physical Interval Note:  05/06/2016 8:15 AM  Michelle Watson  has presented today for surgery, with the diagnosis of ovarian cyst, endometrial polyp   The various methods of treatment have been discussed with the patient and family. After consideration of risks, benefits and other options for treatment, the patient has consented to  Procedure(s) with comments: LAPAROSCOPY OPERATIVE (N/A) - RNFA NEEDED  RIGHT SALPINGO OOPHORECTOMY POSSIBLE LEFT SALPINGO OOPHORECTOMY (Right) [place] (N/A) as a surgical intervention .  The patient's history has been reviewed, patient examined, no change in status, stable for surgery.  I have reviewed the patient's chart and labs.  Questions were answered to the patient's satisfaction.     Salvadore Dom

## 2016-05-06 NOTE — Discharge Instructions (Signed)
Post Anesthesia Home Care Instructions  Activity: Get plenty of rest for the remainder of the day. A responsible adult should stay with you for 24 hours following the procedure.  For the next 24 hours, DO NOT: -Drive a car -Paediatric nurse -Drink alcoholic beverages -Take any medication unless instructed by your physician -Make any legal decisions or sign important papers.  Meals: Start with liquid foods such as gelatin or soup. Progress to regular foods as tolerated. Avoid greasy, spicy, heavy foods. If nausea and/or vomiting occur, drink only clear liquids until the nausea and/or vomiting subsides. Call your physician if vomiting continues.  Special Instructions/Symptoms: Your throat may feel dry or sore from the anesthesia or the breathing tube placed in your throat during surgery. If this causes discomfort, gargle with warm salt water. The discomfort should disappear within 24 hours.  If you had a scopolamine patch placed behind your ear for the management of post- operative nausea and/or vomiting:  1. The medication in the patch is effective for 72 hours, after which it should be removed.  Wrap patch in a tissue and discard in the trash. Wash hands thoroughly with soap and water. 2. You may remove the patch earlier than 72 hours if you experience unpleasant side effects which may include dry mouth, dizziness or visual disturbances. 3. Avoid touching the patch. Wash your hands with soap and water after contact with the patch.   DISCHARGE INSTRUCTIONS: D&C / D&E The following instructions have been prepared to help you care for yourself upon your return home.   Personal hygiene:  Use sanitary pads for vaginal drainage, not tampons.  Shower the day after your procedure.  NO tub baths, pools or Jacuzzis for 2-3 weeks.  Wipe front to back after using the bathroom.  Activity and limitations:  Do NOT drive or operate any equipment for 24 hours. The effects of anesthesia are  still present and drowsiness may result.  Do NOT rest in bed all day.  Walking is encouraged.  Walk up and down stairs slowly.  You may resume your normal activity in one to two days or as indicated by your physician.  Sexual activity: NO intercourse for at least 2 weeks after the procedure, or as indicated by your physician.  Diet: Eat a light meal as desired this evening. You may resume your usual diet tomorrow.  Return to work: You may resume your work activities in one to two days or as indicated by your doctor.  What to expect after your surgery: Expect to have vaginal bleeding/discharge for 2-3 days and spotting for up to 10 days. It is not unusual to have soreness for up to 1-2 weeks. You may have a slight burning sensation when you urinate for the first day. Mild cramps may continue for a couple of days. You may have a regular period in 2-6 weeks.  Call your doctor for any of the following:  Excessive vaginal bleeding, saturating and changing one pad every hour.  Inability to urinate 6 hours after discharge from hospital.  Pain not relieved by pain medication.  Fever of 100.4 F or greater.  Unusual vaginal discharge or odor.   Call for an appointment:    Patients signature: ______________________  Nurses signature ________________________  Support person's signature_______________________   DISCHARGE INSTRUCTIONS: Laparoscopy  The following instructions have been prepared to help you care for yourself upon your return home today.  Wound care:  Do not get the incision wet for the first 24 hours. The  incision should be kept clean and dry.  The Band-Aids or dressings may be removed the day after surgery.  Should the incision become sore, red, and swollen after the first week, check with your doctor.  Personal hygiene:  Shower the day after your procedure.  Activity and limitations:  Do NOT drive or operate any equipment today.  Do NOT lift anything  more than 15 pounds for 2-3 weeks after surgery.  Do NOT rest in bed all day.  Walking is encouraged. Walk each day, starting slowly with 5-minute walks 3 or 4 times a day. Slowly increase the length of your walks.  Walk up and down stairs slowly.  Do NOT do strenuous activities, such as golfing, playing tennis, bowling, running, biking, weight lifting, gardening, mowing, or vacuuming for 2-4 weeks. Ask your doctor when it is okay to start.  Diet: Eat a light meal as desired this evening. You may resume your usual diet tomorrow.  Return to work: This is dependent on the type of work you do. For the most part you can return to a desk job within a week of surgery. If you are more active at work, please discuss this with your doctor.  What to expect after your surgery: You may have a slight burning sensation when you urinate on the first day. You may have a very small amount of blood in the urine. Expect to have a small amount of vaginal discharge/light bleeding for 1-2 weeks. It is not unusual to have abdominal soreness and bruising for up to 2 weeks. You may be tired and need more rest for about 1 week. You may experience shoulder pain for 24-72 hours. Lying flat in bed may relieve it.  Call your doctor for any of the following:  Develop a fever of 100.4 or greater  Inability to urinate 6 hours after discharge from hospital  Severe pain not relieved by pain medications  Persistent of heavy bleeding at incision site  Redness or swelling around incision site after a week  Increasing nausea or vomiting  Patient Signature________________________________________ Nurse Signature_________________________________________

## 2016-05-07 ENCOUNTER — Encounter (HOSPITAL_COMMUNITY): Payer: Self-pay | Admitting: Obstetrics and Gynecology

## 2016-05-20 ENCOUNTER — Ambulatory Visit (INDEPENDENT_AMBULATORY_CARE_PROVIDER_SITE_OTHER): Payer: BLUE CROSS/BLUE SHIELD | Admitting: Obstetrics and Gynecology

## 2016-05-20 ENCOUNTER — Encounter: Payer: Self-pay | Admitting: Obstetrics and Gynecology

## 2016-05-20 VITALS — BP 108/60 | HR 62 | Resp 12 | Ht 63.0 in | Wt 119.2 lb

## 2016-05-20 DIAGNOSIS — Z9889 Other specified postprocedural states: Secondary | ICD-10-CM

## 2016-05-20 NOTE — Progress Notes (Signed)
GYNECOLOGY  VISIT   HPI: 55 y.o.   Married  Caucasian  female   G1P0010 with No LMP recorded. Patient is postmenopausal.   here for 2 week Follow up of laparoscopy bilateral salpingecto-oophorectomy, D&C/Hysteroscopy with Myosure.   She had a right ovarian dermoid and benign endocervical polyp. She has recovered well, no c/o. She had minimal post operative pain. No bowel or bladder c/o. She feels fine.   GYNECOLOGIC HISTORY: No LMP recorded. Patient is postmenopausal. Contraception: none Menopausal hormone therapy: none        OB History    Gravida Para Term Preterm AB Living   1       1     SAB TAB Ectopic Multiple Live Births                     Patient Active Problem List   Diagnosis Date Noted  . Postmenopausal bleeding 03/02/2016  . Migraine with aura     Past Medical History:  Diagnosis Date  . Migraine with aura    yellowish color seen  . Postmenopausal bleeding     Past Surgical History:  Procedure Laterality Date  . DILATATION & CURETTAGE/HYSTEROSCOPY WITH MYOSURE N/A 05/06/2016   Procedure: DILATATION & CURETTAGE/HYSTEROSCOPY WITH MYOSURE;  Surgeon: Salvadore Dom, MD;  Location: [facility];  Service: Gynecology;  Laterality: N/A;  . INDUCED ABORTION    . LAPAROSCOPY N/A 05/06/2016   Procedure: LAPAROSCOPY OPERATIVE;  Surgeon: Salvadore Dom, MD;  Location: [facility];  Service: Gynecology;  Laterality: N/A;  RNFA NEEDED   . SALPINGOOPHORECTOMY Bilateral 05/06/2016   Procedure: Bilateral Salpingo oophorectomy;  Surgeon: Salvadore Dom, MD;  Location: [facility];  Service: Gynecology;  Laterality: Bilateral;    Current Outpatient Prescriptions  Medication Sig Dispense Refill  . triamcinolone (NASACORT ALLERGY 24HR) 55 MCG/ACT AERO nasal inhaler Place 2 sprays into the nose daily.     No current facility-administered medications for this visit.      ALLERGIES: Tequin [gatifloxacin]  Family History  Problem Relation Age of Onset  . Hypertension Mother    . Osteoporosis Mother   . Dementia Mother   . Hypertension Father   . CVA Father   . Ovarian cancer Maternal Aunt     Social History   Social History  . Marital status: Married    Spouse name: N/A  . Number of children: N/A  . Years of education: N/A   Occupational History  . Not on file.   Social History Main Topics  . Smoking status: Never Smoker  . Smokeless tobacco: Never Used  . Alcohol use 1.8 oz/week    3 Standard drinks or equivalent per week  . Drug use: No  . Sexual activity: Yes    Partners: Male    Birth control/ protection: Post-menopausal   Other Topics Concern  . Not on file   Social History Narrative  . No narrative on file    Review of Systems  Constitutional: Negative.   HENT: Negative.   Eyes: Negative.   Respiratory: Negative.   Cardiovascular: Negative.   Gastrointestinal: Negative.   Genitourinary: Negative.   Musculoskeletal: Negative.   Skin: Negative.   Neurological: Negative.   Endo/Heme/Allergies: Negative.   Psychiatric/Behavioral: Negative.     PHYSICAL EXAMINATION:    BP 108/60 (BP Location: Right Arm, Patient Position: Sitting, Cuff Size: Normal)   Pulse 62   Resp 12   Ht 5\' 3"  (1.6 m)   Wt 119 lb 3.2  oz (54.1 kg)   BMI 21.12 kg/m     General appearance: alert, cooperative and appears stated age Abdomen: soft, non-tender; non-distended; no masses,  no organomegaly Incisions: healing well  Pelvic: External genitalia:  no lesions              Urethra:  normal appearing urethra with no masses, tenderness or lesions              Cervix: no cervical motion tenderness              Bimanual Exam:  Uterus:  normal size, contour, position, consistency, mobility, non-tender and anteverted              Adnexa: no mass, fullness, tenderness                ASSESSMENT 2 weeks s/p laparoscopic BSO, hysteroscopy, polypectomy, and D&C for benign disease. She is doing well. No c/o    PLAN F/U as needed She will f/u with her  primary for her routine care   An After Visit Summary was printed and given to the patient.  CC: Dr Janie Morning

## 2018-08-10 DIAGNOSIS — F4320 Adjustment disorder, unspecified: Secondary | ICD-10-CM | POA: Diagnosis not present

## 2018-08-17 DIAGNOSIS — F4320 Adjustment disorder, unspecified: Secondary | ICD-10-CM | POA: Diagnosis not present

## 2018-08-24 DIAGNOSIS — F4320 Adjustment disorder, unspecified: Secondary | ICD-10-CM | POA: Diagnosis not present

## 2018-08-31 DIAGNOSIS — F4320 Adjustment disorder, unspecified: Secondary | ICD-10-CM | POA: Diagnosis not present

## 2018-09-07 DIAGNOSIS — F4320 Adjustment disorder, unspecified: Secondary | ICD-10-CM | POA: Diagnosis not present

## 2018-09-28 DIAGNOSIS — F4320 Adjustment disorder, unspecified: Secondary | ICD-10-CM | POA: Diagnosis not present

## 2018-10-05 DIAGNOSIS — F4320 Adjustment disorder, unspecified: Secondary | ICD-10-CM | POA: Diagnosis not present

## 2018-10-12 DIAGNOSIS — F4320 Adjustment disorder, unspecified: Secondary | ICD-10-CM | POA: Diagnosis not present

## 2018-11-02 DIAGNOSIS — F4320 Adjustment disorder, unspecified: Secondary | ICD-10-CM | POA: Diagnosis not present

## 2018-11-09 DIAGNOSIS — F4320 Adjustment disorder, unspecified: Secondary | ICD-10-CM | POA: Diagnosis not present

## 2018-11-16 DIAGNOSIS — F4320 Adjustment disorder, unspecified: Secondary | ICD-10-CM | POA: Diagnosis not present

## 2018-11-30 DIAGNOSIS — F4320 Adjustment disorder, unspecified: Secondary | ICD-10-CM | POA: Diagnosis not present

## 2018-12-07 DIAGNOSIS — F4320 Adjustment disorder, unspecified: Secondary | ICD-10-CM | POA: Diagnosis not present

## 2018-12-21 DIAGNOSIS — F4320 Adjustment disorder, unspecified: Secondary | ICD-10-CM | POA: Diagnosis not present

## 2019-01-04 DIAGNOSIS — F4320 Adjustment disorder, unspecified: Secondary | ICD-10-CM | POA: Diagnosis not present

## 2019-01-19 DIAGNOSIS — F4320 Adjustment disorder, unspecified: Secondary | ICD-10-CM | POA: Diagnosis not present

## 2019-01-26 DIAGNOSIS — F4320 Adjustment disorder, unspecified: Secondary | ICD-10-CM | POA: Diagnosis not present

## 2019-02-01 DIAGNOSIS — F4320 Adjustment disorder, unspecified: Secondary | ICD-10-CM | POA: Diagnosis not present

## 2019-02-15 DIAGNOSIS — F4320 Adjustment disorder, unspecified: Secondary | ICD-10-CM | POA: Diagnosis not present

## 2019-03-01 DIAGNOSIS — F4320 Adjustment disorder, unspecified: Secondary | ICD-10-CM | POA: Diagnosis not present

## 2019-03-15 DIAGNOSIS — F4320 Adjustment disorder, unspecified: Secondary | ICD-10-CM | POA: Diagnosis not present

## 2019-03-29 DIAGNOSIS — F4320 Adjustment disorder, unspecified: Secondary | ICD-10-CM | POA: Diagnosis not present

## 2019-04-12 DIAGNOSIS — F4320 Adjustment disorder, unspecified: Secondary | ICD-10-CM | POA: Diagnosis not present

## 2019-05-03 DIAGNOSIS — F4320 Adjustment disorder, unspecified: Secondary | ICD-10-CM | POA: Diagnosis not present

## 2019-05-17 DIAGNOSIS — F4320 Adjustment disorder, unspecified: Secondary | ICD-10-CM | POA: Diagnosis not present

## 2019-05-31 DIAGNOSIS — F4320 Adjustment disorder, unspecified: Secondary | ICD-10-CM | POA: Diagnosis not present

## 2019-06-06 ENCOUNTER — Encounter: Payer: Self-pay | Admitting: Obstetrics and Gynecology

## 2019-06-21 DIAGNOSIS — F4320 Adjustment disorder, unspecified: Secondary | ICD-10-CM | POA: Diagnosis not present

## 2019-07-04 ENCOUNTER — Other Ambulatory Visit: Payer: Self-pay

## 2019-07-05 DIAGNOSIS — F4320 Adjustment disorder, unspecified: Secondary | ICD-10-CM | POA: Diagnosis not present

## 2019-07-06 ENCOUNTER — Other Ambulatory Visit (HOSPITAL_COMMUNITY)
Admission: RE | Admit: 2019-07-06 | Discharge: 2019-07-06 | Disposition: A | Discharge status: Home / Self Care | Payer: BC Managed Care – PPO | Source: Ambulatory Visit | Attending: Obstetrics and Gynecology | Admitting: Obstetrics and Gynecology

## 2019-07-06 ENCOUNTER — Encounter: Payer: Self-pay | Admitting: Obstetrics and Gynecology

## 2019-07-06 ENCOUNTER — Other Ambulatory Visit: Payer: Self-pay

## 2019-07-06 ENCOUNTER — Ambulatory Visit (INDEPENDENT_AMBULATORY_CARE_PROVIDER_SITE_OTHER): Payer: BC Managed Care – PPO | Admitting: Obstetrics and Gynecology

## 2019-07-06 VITALS — BP 110/60 | HR 64 | Temp 97.0°F | Ht 63.0 in | Wt 120.2 lb

## 2019-07-06 DIAGNOSIS — Z124 Encounter for screening for malignant neoplasm of cervix: Secondary | ICD-10-CM

## 2019-07-06 DIAGNOSIS — Z01419 Encounter for gynecological examination (general) (routine) without abnormal findings: Secondary | ICD-10-CM

## 2019-07-06 NOTE — Patient Instructions (Signed)

## 2019-07-06 NOTE — Progress Notes (Signed)
58 y.o. G1P0010 Married White or Caucasian Not Hispanic or Latino female here for annual exam.  H/O BSO for benign pathology in 2017. No vaginal bleeding. Sexually active, some discomfort, helped with lubrication.     No LMP recorded. Patient is postmenopausal.          Sexually active: Yes.    The current method of family planning is post menopausal status.    Exercising: Yes.    yoga, walking Smoker:  no  Health Maintenance: Pap:  06/2015 WNL(w/ PCP) per Patient History of abnormal Pap:  no MMG:  06/2017 WNL per patient [facility])  Colonoscopy:  08/2012 WNL per patient  BMD:   Never TDaP:  2013 Gardasil: N/A   reports that she has never smoked. She has never used smokeless tobacco. She reports current alcohol use of about 2.0 standard drinks of alcohol per week. She reports that she does not use drugs. She is a Orthoptist MD, hospice.  Past Medical History:  Diagnosis Date  . Migraine with aura    yellowish color seen  . Postmenopausal bleeding     Past Surgical History:  Procedure Laterality Date  . DILATATION & CURETTAGE/HYSTEROSCOPY WITH MYOSURE N/A 05/06/2016   Procedure: DILATATION & CURETTAGE/HYSTEROSCOPY WITH MYOSURE;  Surgeon: Salvadore Dom, MD;  Location: [facility];  Service: Gynecology;  Laterality: N/A;  . INDUCED ABORTION    . LAPAROSCOPY N/A 05/06/2016   Procedure: LAPAROSCOPY OPERATIVE;  Surgeon: Salvadore Dom, MD;  Location: [facility];  Service: Gynecology;  Laterality: N/A;  RNFA NEEDED   . SALPINGOOPHORECTOMY Bilateral 05/06/2016   Procedure: Bilateral Salpingo oophorectomy;  Surgeon: Salvadore Dom, MD;  Location: [facility];  Service: Gynecology;  Laterality: Bilateral;    Current Outpatient Medications  Medication Sig Dispense Refill  . triamcinolone (NASACORT ALLERGY 24HR) 55 MCG/ACT AERO nasal inhaler Place 2 sprays into the nose daily.    Marland Kitchen VITAMIN D PO Take by mouth.     No current facility-administered medications for this visit.      Family History  Problem Relation Age of Onset  . Hypertension Mother   . Osteoporosis Mother   . Dementia Mother   . Hypertension Father   . CVA Father   . Ovarian cancer Maternal Aunt     Review of Systems  Constitutional: Negative.   HENT: Negative.   Eyes: Negative.   Respiratory: Negative.   Cardiovascular: Negative.   Gastrointestinal: Negative.   Endocrine: Negative.   Genitourinary: Negative.   Musculoskeletal: Negative.   Skin: Negative.   Allergic/Immunologic: Negative.   Neurological: Negative.   Hematological: Negative.   Psychiatric/Behavioral: Negative.     Exam:   BP 110/60 (BP Location: Right Arm, Patient Position: Sitting, Cuff Size: Normal)   Pulse 64   Temp (!) 97 F (36.1 C) (Skin)   Ht 5\' 3"  (1.6 m)   Wt 120 lb 3.2 oz (54.5 kg)   BMI 21.29 kg/m   Weight change: @WEIGHTCHANGE @ Height:   Height: 5\' 3"  (160 cm)  Ht Readings from Last 3 Encounters:  07/06/19 5\' 3"  (1.6 m)  05/20/16 5\' 3"  (1.6 m)  04/22/16 5\' 3"  (1.6 m)    General appearance: alert, cooperative and appears stated age Head: Normocephalic, without obvious abnormality, atraumatic Neck: no adenopathy, supple, symmetrical, trachea midline and thyroid normal to inspection and palpation Lungs: clear to auscultation bilaterally Cardiovascular: regular rate and rhythm Breasts: normal appearance, no masses or tenderness Abdomen: soft, non-tender; non distended,  no masses,  no  organomegaly Extremities: extremities normal, atraumatic, no cyanosis or edema Skin: Skin color, texture, turgor normal. No rashes or lesions Lymph nodes: Cervical, supraclavicular, and axillary nodes normal. No abnormal inguinal nodes palpated Neurologic: Grossly normal   Pelvic: External genitalia:  no lesions              Urethra:  normal appearing urethra with no masses, tenderness or lesions              Bartholins and Skenes: normal                 Vagina: normal appearing vagina with normal color and  discharge, no lesions              Cervix: no lesions               Bimanual Exam:  Uterus:  normal size, contour, position, consistency, mobility, non-tender              Adnexa: no mass, fullness, tenderness               Rectovaginal: Confirms               Anus:  normal sphincter tone, no lesions  Chaperone was present for exam.  A:  Well Woman with normal exam  P:   Pap with hpv  Mammogram is overdue  Colonoscopy is UTD  Discussed breast self exam  Discussed calcium and vit D intake

## 2019-07-06 NOTE — Addendum Note (Signed)
Addended by: Dorothy Spark on: 07/06/2019 04:50 PM   Modules accepted: Orders

## 2019-07-07 ENCOUNTER — Other Ambulatory Visit: Payer: Self-pay | Admitting: Obstetrics and Gynecology

## 2019-07-07 DIAGNOSIS — Z1231 Encounter for screening mammogram for malignant neoplasm of breast: Secondary | ICD-10-CM

## 2019-07-11 LAB — CYTOLOGY - PAP
Diagnosis: NEGATIVE
HPV: NOT DETECTED

## 2019-07-18 ENCOUNTER — Other Ambulatory Visit: Payer: Self-pay

## 2019-07-18 ENCOUNTER — Ambulatory Visit
Admission: RE | Admit: 2019-07-18 | Discharge: 2019-07-18 | Disposition: A | Discharge status: Home / Self Care | Payer: BC Managed Care – PPO | Source: Ambulatory Visit | Attending: Obstetrics and Gynecology | Admitting: Obstetrics and Gynecology

## 2019-07-18 DIAGNOSIS — Z1231 Encounter for screening mammogram for malignant neoplasm of breast: Secondary | ICD-10-CM | POA: Diagnosis not present

## 2019-07-19 DIAGNOSIS — F4320 Adjustment disorder, unspecified: Secondary | ICD-10-CM | POA: Diagnosis not present

## 2019-07-27 DIAGNOSIS — F4320 Adjustment disorder, unspecified: Secondary | ICD-10-CM | POA: Diagnosis not present

## 2019-08-07 DIAGNOSIS — Z23 Encounter for immunization: Secondary | ICD-10-CM | POA: Diagnosis not present

## 2019-08-09 DIAGNOSIS — F4320 Adjustment disorder, unspecified: Secondary | ICD-10-CM | POA: Diagnosis not present

## 2019-08-23 DIAGNOSIS — F4320 Adjustment disorder, unspecified: Secondary | ICD-10-CM | POA: Diagnosis not present

## 2019-09-06 DIAGNOSIS — F4320 Adjustment disorder, unspecified: Secondary | ICD-10-CM | POA: Diagnosis not present

## 2019-09-18 DIAGNOSIS — F4320 Adjustment disorder, unspecified: Secondary | ICD-10-CM | POA: Diagnosis not present

## 2019-10-03 DIAGNOSIS — Z6821 Body mass index (BMI) 21.0-21.9, adult: Secondary | ICD-10-CM | POA: Diagnosis not present

## 2019-10-03 DIAGNOSIS — Z0000 Encounter for general adult medical examination without abnormal findings: Secondary | ICD-10-CM | POA: Diagnosis not present

## 2019-10-04 DIAGNOSIS — F4320 Adjustment disorder, unspecified: Secondary | ICD-10-CM | POA: Diagnosis not present

## 2019-10-16 DIAGNOSIS — F4320 Adjustment disorder, unspecified: Secondary | ICD-10-CM | POA: Diagnosis not present

## 2019-11-08 DIAGNOSIS — F4320 Adjustment disorder, unspecified: Secondary | ICD-10-CM | POA: Diagnosis not present

## 2020-03-12 DIAGNOSIS — N611 Abscess of the breast and nipple: Secondary | ICD-10-CM | POA: Diagnosis not present

## 2020-03-13 DIAGNOSIS — N611 Abscess of the breast and nipple: Secondary | ICD-10-CM | POA: Diagnosis not present

## 2020-06-25 ENCOUNTER — Other Ambulatory Visit: Payer: Self-pay | Admitting: Family Medicine

## 2020-06-25 DIAGNOSIS — Z1231 Encounter for screening mammogram for malignant neoplasm of breast: Secondary | ICD-10-CM

## 2020-07-05 ENCOUNTER — Other Ambulatory Visit: Payer: Self-pay | Admitting: Family Medicine

## 2020-07-05 DIAGNOSIS — Z1231 Encounter for screening mammogram for malignant neoplasm of breast: Secondary | ICD-10-CM

## 2020-07-08 DIAGNOSIS — H4311 Vitreous hemorrhage, right eye: Secondary | ICD-10-CM | POA: Diagnosis not present

## 2020-07-08 DIAGNOSIS — H2513 Age-related nuclear cataract, bilateral: Secondary | ICD-10-CM | POA: Diagnosis not present

## 2020-07-08 DIAGNOSIS — H43813 Vitreous degeneration, bilateral: Secondary | ICD-10-CM | POA: Diagnosis not present

## 2020-07-08 DIAGNOSIS — H35372 Puckering of macula, left eye: Secondary | ICD-10-CM | POA: Diagnosis not present

## 2020-07-15 DIAGNOSIS — H35372 Puckering of macula, left eye: Secondary | ICD-10-CM | POA: Diagnosis not present

## 2020-07-15 DIAGNOSIS — H4311 Vitreous hemorrhage, right eye: Secondary | ICD-10-CM | POA: Diagnosis not present

## 2020-07-15 DIAGNOSIS — H43811 Vitreous degeneration, right eye: Secondary | ICD-10-CM | POA: Diagnosis not present

## 2020-07-15 DIAGNOSIS — H2513 Age-related nuclear cataract, bilateral: Secondary | ICD-10-CM | POA: Diagnosis not present

## 2020-07-25 ENCOUNTER — Ambulatory Visit
Admission: RE | Admit: 2020-07-25 | Discharge: 2020-07-25 | Disposition: A | Discharge status: Home / Self Care | Payer: BC Managed Care – PPO | Source: Ambulatory Visit

## 2020-07-25 ENCOUNTER — Other Ambulatory Visit: Payer: Self-pay

## 2020-07-25 DIAGNOSIS — Z1231 Encounter for screening mammogram for malignant neoplasm of breast: Secondary | ICD-10-CM | POA: Diagnosis not present

## 2020-08-19 DIAGNOSIS — H35372 Puckering of macula, left eye: Secondary | ICD-10-CM | POA: Diagnosis not present

## 2020-08-19 DIAGNOSIS — H43811 Vitreous degeneration, right eye: Secondary | ICD-10-CM | POA: Diagnosis not present

## 2020-08-19 DIAGNOSIS — H2513 Age-related nuclear cataract, bilateral: Secondary | ICD-10-CM | POA: Diagnosis not present

## 2020-08-19 DIAGNOSIS — H43391 Other vitreous opacities, right eye: Secondary | ICD-10-CM | POA: Diagnosis not present

## 2020-08-20 ENCOUNTER — Ambulatory Visit (INDEPENDENT_AMBULATORY_CARE_PROVIDER_SITE_OTHER): Payer: BC Managed Care – PPO

## 2020-08-20 ENCOUNTER — Other Ambulatory Visit: Payer: Self-pay

## 2020-08-20 DIAGNOSIS — Z23 Encounter for immunization: Secondary | ICD-10-CM | POA: Diagnosis not present

## 2020-08-21 ENCOUNTER — Encounter: Payer: Self-pay | Admitting: Family Medicine

## 2020-10-10 ENCOUNTER — Ambulatory Visit (INDEPENDENT_AMBULATORY_CARE_PROVIDER_SITE_OTHER): Payer: BC Managed Care – PPO | Admitting: Family Medicine

## 2020-10-10 ENCOUNTER — Encounter: Payer: Self-pay | Admitting: Family Medicine

## 2020-10-10 ENCOUNTER — Other Ambulatory Visit: Payer: Self-pay

## 2020-10-10 VITALS — BP 136/82 | HR 63 | Temp 97.3°F | Ht 62.0 in | Wt 128.0 lb

## 2020-10-10 DIAGNOSIS — Z0000 Encounter for general adult medical examination without abnormal findings: Secondary | ICD-10-CM | POA: Diagnosis not present

## 2020-10-10 DIAGNOSIS — Z23 Encounter for immunization: Secondary | ICD-10-CM

## 2020-10-10 DIAGNOSIS — L219 Seborrheic dermatitis, unspecified: Secondary | ICD-10-CM

## 2020-10-10 MED ORDER — CLOTRIMAZOLE-BETAMETHASONE 1-0.05 % EX CREA: 1.0000 "application " | TOPICAL_CREAM | Freq: Two times a day (BID) | CUTANEOUS | 2 refills | Status: DC

## 2020-10-10 NOTE — Patient Instructions (Addendum)
Merry Christmas! I sent lotrisone for seborrheic dermatitis.  Preventive Care 9-60 Years Old, Female Preventive care refers to visits with your health care provider and lifestyle choices that can promote health and wellness. This includes:  A yearly physical exam. This may also be called an annual well check.  Regular dental visits and eye exams.  Immunizations.  Screening for certain conditions.  Healthy lifestyle choices, such as eating a healthy diet, getting regular exercise, not using drugs or products that contain nicotine and tobacco, and limiting alcohol use. What can I expect for my preventive care visit? Physical exam Your health care provider will check your:  Height and weight. This may be used to calculate body mass index (BMI), which tells if you are at a healthy weight.  Heart rate and blood pressure.  Skin for abnormal spots. Counseling Your health care provider may ask you questions about your:  Alcohol, tobacco, and drug use.  Emotional well-being.  Home and relationship well-being.  Sexual activity.  Eating habits.  Work and work Statistician.  Method of birth control.  Menstrual cycle.  Pregnancy history. What immunizations do I need?  Influenza (flu) vaccine  This is recommended every year. Tetanus, diphtheria, and pertussis (Tdap) vaccine  You may need a Td booster every 10 years. Varicella (chickenpox) vaccine  You may need this if you have not been vaccinated. Zoster (shingles) vaccine  You may need this after age 33. Measles, mumps, and rubella (MMR) vaccine  You may need at least one dose of MMR if you were born in 1957 or later. You may also need a second dose. Pneumococcal conjugate (PCV13) vaccine  You may need this if you have certain conditions and were not previously vaccinated. Pneumococcal polysaccharide (PPSV23) vaccine  You may need one or two doses if you smoke cigarettes or if you have certain  conditions. Meningococcal conjugate (MenACWY) vaccine  You may need this if you have certain conditions. Hepatitis A vaccine  You may need this if you have certain conditions or if you travel or work in places where you may be exposed to hepatitis A. Hepatitis B vaccine  You may need this if you have certain conditions or if you travel or work in places where you may be exposed to hepatitis B. Haemophilus influenzae type b (Hib) vaccine  You may need this if you have certain conditions. Human papillomavirus (HPV) vaccine  If recommended by your health care provider, you may need three doses over 6 months. You may receive vaccines as individual doses or as more than one vaccine together in one shot (combination vaccines). Talk with your health care provider about the risks and benefits of combination vaccines. What tests do I need? Blood tests  Lipid and cholesterol levels. These may be checked every 5 years, or more frequently if you are over 47 years old.  Hepatitis C test.  Hepatitis B test. Screening  Lung cancer screening. You may have this screening every year starting at age 55 if you have a 30-pack-year history of smoking and currently smoke or have quit within the past 15 years.  Colorectal cancer screening. All adults should have this screening starting at age 50 and continuing until age 31. Your health care provider may recommend screening at age 47 if you are at increased risk. You will have tests every 1-10 years, depending on your results and the type of screening test.  Diabetes screening. This is done by checking your blood sugar (glucose) after you have not  eaten for a while (fasting). You may have this done every 1-3 years.  Mammogram. This may be done every 1-2 years. Talk with your health care provider about when you should start having regular mammograms. This may depend on whether you have a family history of breast cancer.  BRCA-related cancer screening. This  may be done if you have a family history of breast, ovarian, tubal, or peritoneal cancers.  Pelvic exam and Pap test. This may be done every 3 years starting at age 16. Starting at age 20, this may be done every 5 years if you have a Pap test in combination with an HPV test. Other tests  Sexually transmitted disease (STD) testing.  Bone density scan. This is done to screen for osteoporosis. You may have this scan if you are at high risk for osteoporosis. Follow these instructions at home: Eating and drinking  Eat a diet that includes fresh fruits and vegetables, whole grains, lean protein, and low-fat dairy.  Take vitamin and mineral supplements as recommended by your health care provider.  Do not drink alcohol if: ? Your health care provider tells you not to drink. ? You are pregnant, may be pregnant, or are planning to become pregnant.  If you drink alcohol: ? Limit how much you have to 0-1 drink a day. ? Be aware of how much alcohol is in your drink. In the [place] one drink equals one 12 oz bottle of beer (355 mL), one 5 oz glass of wine (148 mL), or one 1 oz glass of hard liquor (44 mL). Lifestyle  Take daily care of your teeth and gums.  Stay active. Exercise for at least 30 minutes on 5 or more days each week.  Do not use any products that contain nicotine or tobacco, such as cigarettes, e-cigarettes, and chewing tobacco. If you need help quitting, ask your health care provider.  If you are sexually active, practice safe sex. Use a condom or other form of birth control (contraception) in order to prevent pregnancy and STIs (sexually transmitted infections).  If told by your health care provider, take low-dose aspirin daily starting at age 54. What's next?  Visit your health care provider once a year for a well check visit.  Ask your health care provider how often you should have your eyes and teeth checked.  Stay up to date on all vaccines. This information is not  intended to replace advice given to you by your health care provider. Make sure you discuss any questions you have with your health care provider. Document Revised: 06/23/2018 Document Reviewed: 06/23/2018 Elsevier Patient Education  [place]. Zoster Vaccine, Recombinant injection What is this medicine? ZOSTER VACCINE (ZOS ter vak SEEN) is used to prevent shingles in adults 60 years old and over. This vaccine is not used to treat shingles or nerve pain from shingles. This medicine may be used for other purposes; ask your health care provider or pharmacist if you have questions. COMMON BRAND NAME(S): [facility] What should I tell my health care provider before I take this medicine? They need to know if you have any of these conditions:  blood disorders or disease  cancer like leukemia or lymphoma  immune system problems or therapy  an unusual or allergic reaction to vaccines, other medications, foods, dyes, or preservatives  pregnant or trying to get pregnant  breast-feeding How should I use this medicine? This vaccine is for injection in a muscle. It is given by a health care professional. Talk  to your pediatrician regarding the use of this medicine in children. This medicine is not approved for use in children. Overdosage: If you think you have taken too much of this medicine contact a poison control center or emergency room at once. NOTE: This medicine is only for you. Do not share this medicine with others. What if I miss a dose? Keep appointments for follow-up (booster) doses as directed. It is important not to miss your dose. Call your doctor or health care professional if you are unable to keep an appointment. What may interact with this medicine?  medicines that suppress your immune system  medicines to treat cancer  steroid medicines like prednisone or cortisone This list may not describe all possible interactions. Give your health care provider a list of all the  medicines, herbs, non-prescription drugs, or dietary supplements you use. Also tell them if you smoke, drink alcohol, or use illegal drugs. Some items may interact with your medicine. What should I watch for while using this medicine? Visit your doctor for regular check ups. This vaccine, like all vaccines, may not fully protect everyone. What side effects may I notice from receiving this medicine? Side effects that you should report to your doctor or health care professional as soon as possible:  allergic reactions like skin rash, itching or hives, swelling of the face, lips, or tongue  breathing problems Side effects that usually do not require medical attention (report these to your doctor or health care professional if they continue or are bothersome):  chills  headache  fever  nausea, vomiting  redness, warmth, pain, swelling or itching at site where injected  tiredness This list may not describe all possible side effects. Call your doctor for medical advice about side effects. You may report side effects to FDA at 1-800-FDA-1088. Where should I keep my medicine? This vaccine is only given in a clinic, pharmacy, doctor's office, or other health care setting and will not be stored at home. NOTE: This sheet is a summary. It may not cover all possible information. If you have questions about this medicine, talk to your doctor, pharmacist, or health care provider.  2020 Elsevier/Gold Standard (2017-05-24 13:20:30)

## 2020-10-10 NOTE — Progress Notes (Signed)
Subjective:  Patient ID: Michelle Watson, female    DOB: 1961/01/09  Age: 59 y.o. MRN: [id number]  Chief Complaint  Patient presents with  . Annual Exam    HPI Well Adult Physical: Patient here for a comprehensive physical exam.The patient reports no problems Do you take any herbs or supplements that were not prescribed by a doctor? yes Are you taking calcium supplements? no Are you taking aspirin daily? no  Encounter for general adult medical examination without abnormal findings  Physical ("At Risk" items are starred): Patient's last physical exam was 1 year ago .  Smoking: Life-long non-smoker ;  Physical Activity: Exercises at least 3 times per week ;  Alcohol/Drug Use: Is a non-drinker ; No illicit drug use ;  Patient is not afflicted from Stress Incontinence and Urge Incontinence  Safety: reviewed. Patient wears a seat belt, has smoke detectors, has carbon monoxide detectors, practices appropriate gun safety, and wears sunscreen with extended sun exposure. Dental Care: biannual cleanings, brushes and flosses daily. Ophthalmology/Optometry: Annual visit.  Hearing loss: none Vision impairments: glasses. LMP: 59 yo Pregnancy history: Pregnancy x 1. Miscarriage x 1.  Safe at home: yes Self breast exams: yes  Over the last year the patient developed a right breast abscess which was I and Dd, by Forestine Na, MD. Left axilla - small abscess drained on it's own. Pimple on face started to develop abscess, but resolved.  Developed seborrheic dermatitis over the last year. OTC antifungal and OTC steroid cream.  Eyes: developed vitreous hemorrhage. Has seen ophthomology.   DEXA-Never Colonoscopy-08/22/2012 MAMMOFINDINGS-07/25/2020 Pap done by gynecology.  [place] Office Visit from 10/10/2020 in [place]  PHQ-2 Total Score 0        Social Hx   Social History   Socioeconomic History  . Marital status: Married    Spouse name: Not on file  . Number of  children: Not on file  . Years of education: Not on file  . Highest education level: Not on file  Occupational History  . Not on file  Tobacco Use  . Smoking status: Never Smoker  . Smokeless tobacco: Never Used  Vaping Use  . Vaping Use: Never used  Substance and Sexual Activity  . Alcohol use: Yes    Alcohol/week: 2.0 standard drinks    Types: 2 Standard drinks or equivalent per week  . Drug use: No  . Sexual activity: Yes    Partners: Male    Birth control/protection: Post-menopausal  Other Topics Concern  . Not on file  Social History Narrative  . Not on file   Social Determinants of Health   Financial Resource Strain: Not on file  Food Insecurity: Not on file  Transportation Needs: Not on file  Physical Activity: Not on file  Stress: Not on file  Social Connections: Not on file   Past Medical History:  Diagnosis Date  . Migraine with aura    yellowish color seen  . Postmenopausal bleeding    Past Surgical History:  Procedure Laterality Date  . DILATATION & CURETTAGE/HYSTEROSCOPY WITH MYOSURE N/A 05/06/2016   Procedure: DILATATION & CURETTAGE/HYSTEROSCOPY WITH MYOSURE;  Surgeon: Salvadore Dom, MD;  Location: [facility];  Service: Gynecology;  Laterality: N/A;  . INDUCED ABORTION    . LAPAROSCOPY N/A 05/06/2016   Procedure: LAPAROSCOPY OPERATIVE;  Surgeon: Salvadore Dom, MD;  Location: [facility];  Service: Gynecology;  Laterality: N/A;  RNFA NEEDED   . SALPINGOOPHORECTOMY Bilateral 05/06/2016   Procedure: Bilateral  Salpingo oophorectomy;  Surgeon: Salvadore Dom, MD;  Location: [facility];  Service: Gynecology;  Laterality: Bilateral;    Family History  Problem Relation Age of Onset  . Hypertension Mother   . Osteoporosis Mother   . Dementia Mother   . Hypertension Father   . CVA Father   . Ovarian cancer Maternal Aunt     Review of Systems  Constitutional: Negative for chills, fatigue and fever.  HENT: Negative for congestion, ear pain, rhinorrhea  and sore throat.   Respiratory: Negative for cough and shortness of breath.   Cardiovascular: Negative for chest pain.  Gastrointestinal: Negative for abdominal pain, constipation, diarrhea, nausea and vomiting.  Genitourinary: Negative for dysuria and urgency.  Musculoskeletal: Negative for back pain and myalgias.  Neurological: Negative for dizziness, weakness, light-headedness and headaches.  Psychiatric/Behavioral: Negative for dysphoric mood. The patient is not nervous/anxious.      Objective:  BP 136/82   Pulse 63   Temp (!) 97.3 F (36.3 C)   Ht 5\' 2"  (1.575 m)   Wt 128 lb (58.1 kg)   SpO2 97%   BMI 23.41 kg/m   BP/Weight 10/10/2020 07/06/2019 02/01/8118  Systolic BP 147 829 562  Diastolic BP 82 60 60  Wt. (Lbs) 128 120.2 119.2  BMI 23.41 21.29 21.12    Physical Exam Vitals reviewed.  Constitutional:      General: She is not in acute distress.    Appearance: Normal appearance. She is obese.  HENT:     Right Ear: Tympanic membrane and ear canal normal.     Left Ear: Tympanic membrane and ear canal normal.     Nose: Nose normal. No congestion or rhinorrhea.  Eyes:     Conjunctiva/sclera: Conjunctivae normal.  Neck:     Thyroid: No thyroid mass.     Vascular: No carotid bruit.  Cardiovascular:     Rate and Rhythm: Normal rate and regular rhythm.     Pulses: Normal pulses.     Heart sounds: No murmur heard.   Pulmonary:     Effort: Pulmonary effort is normal.     Breath sounds: Normal breath sounds.  Abdominal:     General: Bowel sounds are normal.     Palpations: Abdomen is soft.     Tenderness: There is no abdominal tenderness.  Lymphadenopathy:     Cervical: No cervical adenopathy.  Skin:    Findings: Rash (seborrheic dermatitis posterior to BL ears. and over eyebrows. ) present.  Neurological:     Mental Status: She is alert and oriented to person, place, and time.     Cranial Nerves: No cranial nerve deficit.  Psychiatric:        Mood and Affect:  Mood normal.        Behavior: Behavior normal.     Lab Results  Component Value Date   WBC 7.3 04/22/2016   HGB 12.6 04/22/2016   HCT 38.3 04/22/2016   PLT 190 04/22/2016   GLUCOSE 106 (H) 04/22/2016   ALT 14 04/22/2016   AST 18 04/22/2016   NA 136 04/22/2016   K 4.0 04/22/2016   CL 101 04/22/2016   CREATININE 0.68 04/22/2016   BUN 33 (H) 04/22/2016   CO2 29 04/22/2016      Assessment & Plan:  1. Routine medical exam Well female. Very healthy. I do recommend increase exercise. - CBC with Differential/Platelet - Comprehensive metabolic panel - Lipid panel - TSH  2. Need for zoster vaccination - Varicella-zoster vaccine IM (  Shingrix) #1 given.  Follow up in 2 months for second shingrix vaccine.  3. Seborrheic dermatitis  Lotrisone cream.  Body mass index is 23.41 kg/m.   This is a list of the screening recommended for you and due dates:  Health Maintenance  Topic Date Due  .  Hepatitis C: One time screening is recommended by Center for Disease Control  (CDC) for  adults born from [birth year] through 1966.   Never done  . HIV Screening  Never done  . Colon Cancer Screening  Never done  . Tetanus Vaccine  10/26/2018  . Pap Smear  07/05/2022  . Mammogram  07/25/2022  . Flu Shot  Completed  . COVID-19 Vaccine  Completed     AN INDIVIDUALIZED CARE PLAN: was established or reinforced today.   SELF MANAGEMENT: The patient and I together assessed ways to personally work towards obtaining the recommended goals  Support needs The patient and/or family needs were assessed and services were offered if appropriate.  Meds ordered this encounter  Medications  . clotrimazole-betamethasone (LOTRISONE) cream    Sig: Apply 1 application topically 2 (two) times daily.    Dispense:  30 g    Refill:  2    Follow-up: Return in about 1 year (around 10/10/2021) for fasting CPE.  An After Visit Summary was printed and given to the patient.  Rochel Brome, MD [facility] [phone]

## 2020-10-11 LAB — CBC WITH DIFFERENTIAL/PLATELET
Basophils Absolute: 0.1 10*3/uL (ref 0.0–0.2)
Basos: 1 %
EOS (ABSOLUTE): 0.1 10*3/uL (ref 0.0–0.4)
Eos: 1 %
Hematocrit: 41.1 % (ref 34.0–46.6)
Hemoglobin: 13.6 g/dL (ref 11.1–15.9)
Immature Grans (Abs): 0 10*3/uL (ref 0.0–0.1)
Immature Granulocytes: 0 %
Lymphocytes Absolute: 2.2 10*3/uL (ref 0.7–3.1)
Lymphs: 29 %
MCH: 31.6 pg (ref 26.6–33.0)
MCHC: 33.1 g/dL (ref 31.5–35.7)
MCV: 96 fL (ref 79–97)
Monocytes Absolute: 0.4 10*3/uL (ref 0.1–0.9)
Monocytes: 6 %
Neutrophils Absolute: 4.8 10*3/uL (ref 1.4–7.0)
Neutrophils: 63 %
Platelets: 242 10*3/uL (ref 150–450)
RBC: 4.3 x10E6/uL (ref 3.77–5.28)
RDW: 12 % (ref 11.7–15.4)
WBC: 7.6 10*3/uL (ref 3.4–10.8)

## 2020-10-11 LAB — COMPREHENSIVE METABOLIC PANEL
ALT: 16 IU/L (ref 0–32)
AST: 23 IU/L (ref 0–40)
Albumin/Globulin Ratio: 2.1 (ref 1.2–2.2)
Albumin: 4.9 g/dL (ref 3.8–4.9)
Alkaline Phosphatase: 69 IU/L (ref 44–121)
BUN/Creatinine Ratio: 38 — ABNORMAL HIGH (ref 12–28)
BUN: 24 mg/dL (ref 8–27)
Bilirubin Total: 0.3 mg/dL (ref 0.0–1.2)
CO2: 28 mmol/L (ref 20–29)
Calcium: 9.5 mg/dL (ref 8.7–10.3)
Chloride: 98 mmol/L (ref 96–106)
Creatinine, Ser: 0.63 mg/dL (ref 0.57–1.00)
GFR calc Af Amer: 113 mL/min/{1.73_m2} (ref >59)
GFR calc non Af Amer: 98 mL/min/{1.73_m2} (ref >59)
Globulin, Total: 2.3 g/dL (ref 1.5–4.5)
Glucose: 92 mg/dL (ref 65–99)
Potassium: 4.2 mmol/L (ref 3.5–5.2)
Sodium: 140 mmol/L (ref 134–144)
Total Protein: 7.2 g/dL (ref 6.0–8.5)

## 2020-10-11 LAB — TSH: TSH: 2.06 u[IU]/mL (ref 0.450–4.500)

## 2020-10-11 LAB — LIPID PANEL
Chol/HDL Ratio: 3.2 ratio (ref 0.0–4.4)
Cholesterol, Total: 197 mg/dL (ref 100–199)
HDL: 62 mg/dL (ref >39)
LDL Chol Calc (NIH): 123 mg/dL — ABNORMAL HIGH (ref 0–99)
Triglycerides: 64 mg/dL (ref 0–149)
VLDL Cholesterol Cal: 12 mg/dL (ref 5–40)

## 2020-10-11 LAB — CARDIOVASCULAR RISK ASSESSMENT

## 2020-10-27 ENCOUNTER — Encounter: Payer: Self-pay | Admitting: Family Medicine

## 2020-11-14 ENCOUNTER — Telehealth (INDEPENDENT_AMBULATORY_CARE_PROVIDER_SITE_OTHER): Payer: BC Managed Care – PPO

## 2020-11-14 ENCOUNTER — Ambulatory Visit: Payer: BC Managed Care – PPO

## 2020-11-14 DIAGNOSIS — Z23 Encounter for immunization: Secondary | ICD-10-CM

## 2020-11-14 NOTE — Telephone Encounter (Signed)
Pt came in for her Shingrix shot. Pt received injection in her left deltoid, pt tolerated injection well.

## 2020-11-18 DIAGNOSIS — H43811 Vitreous degeneration, right eye: Secondary | ICD-10-CM | POA: Diagnosis not present

## 2020-11-18 DIAGNOSIS — H2513 Age-related nuclear cataract, bilateral: Secondary | ICD-10-CM | POA: Diagnosis not present

## 2020-11-18 DIAGNOSIS — H43391 Other vitreous opacities, right eye: Secondary | ICD-10-CM | POA: Diagnosis not present

## 2020-11-18 DIAGNOSIS — H35372 Puckering of macula, left eye: Secondary | ICD-10-CM | POA: Diagnosis not present

## 2021-03-14 ENCOUNTER — Ambulatory Visit (INDEPENDENT_AMBULATORY_CARE_PROVIDER_SITE_OTHER): Payer: BC Managed Care – PPO | Admitting: Legal Medicine

## 2021-03-14 ENCOUNTER — Encounter: Payer: Self-pay | Admitting: Legal Medicine

## 2021-03-14 ENCOUNTER — Other Ambulatory Visit: Payer: Self-pay

## 2021-03-14 VITALS — BP 110/60 | HR 65 | Temp 97.3°F | Resp 16 | Ht 63.5 in | Wt 129.0 lb

## 2021-03-14 DIAGNOSIS — L03811 Cellulitis of head [any part, except face]: Secondary | ICD-10-CM | POA: Diagnosis not present

## 2021-03-14 MED ORDER — DOXYCYCLINE HYCLATE 100 MG PO TABS: 100.0000 mg | ORAL_TABLET | Freq: Two times a day (BID) | ORAL | 0 refills | Status: DC

## 2021-03-14 NOTE — Progress Notes (Signed)
Subjective:  Patient ID: Michelle Watson, female    DOB: 12/14/1960  Age: 60 y.o. MRN: [id number]  Chief Complaint  Patient presents with  . Abscess    HPI: seborrheic dermatitis on neck chronic.  Worse for one month.  It is 4 cm and red with tenderness.     Current Outpatient Medications on File Prior to Visit  Medication Sig Dispense Refill  . Calcium Citrate-Vitamin D (CITRACAL PETITES/VITAMIN D) 200-250 MG-UNIT TABS Take 2 tablets by mouth in the morning and at bedtime.    . clotrimazole-betamethasone (LOTRISONE) cream Apply 1 application topically 2 (two) times daily. 30 g 2  . loratadine (CLARITIN) 10 MG tablet Take 10 mg by mouth daily.    . polyethylene glycol powder (GLYCOLAX/MIRALAX) 17 GM/SCOOP powder Take 1 Container by mouth daily.    Marland Kitchen triamcinolone (NASACORT) 55 MCG/ACT AERO nasal inhaler Place 2 sprays into the nose daily.     No current facility-administered medications on file prior to visit.   Past Medical History:  Diagnosis Date  . Migraine with aura    yellowish color seen  . Postmenopausal bleeding    Past Surgical History:  Procedure Laterality Date  . DILATATION & CURETTAGE/HYSTEROSCOPY WITH MYOSURE N/A 05/06/2016   Procedure: DILATATION & CURETTAGE/HYSTEROSCOPY WITH MYOSURE;  Surgeon: Salvadore Dom, MD;  Location: [facility];  Service: Gynecology;  Laterality: N/A;  . INDUCED ABORTION    . LAPAROSCOPY N/A 05/06/2016   Procedure: LAPAROSCOPY OPERATIVE;  Surgeon: Salvadore Dom, MD;  Location: [facility];  Service: Gynecology;  Laterality: N/A;  RNFA NEEDED   . SALPINGOOPHORECTOMY Bilateral 05/06/2016   Procedure: Bilateral Salpingo oophorectomy;  Surgeon: Salvadore Dom, MD;  Location: [facility];  Service: Gynecology;  Laterality: Bilateral;    Family History  Problem Relation Age of Onset  . Hypertension Mother   . Osteoporosis Mother   . Dementia Mother   . Hypertension Father   . CVA Father   . Ovarian cancer Maternal Aunt    Social  History   Socioeconomic History  . Marital status: Married    Spouse name: Not on file  . Number of children: 0  . Years of education: Not on file  . Highest education level: Not on file  Occupational History  . Occupation: Physician   Tobacco Use  . Smoking status: Never Smoker  . Smokeless tobacco: Never Used  Vaping Use  . Vaping Use: Never used  Substance and Sexual Activity  . Alcohol use: Yes    Alcohol/week: 2.0 standard drinks    Types: 2 Standard drinks or equivalent per week  . Drug use: No  . Sexual activity: Not Currently    Partners: Male    Birth control/protection: Post-menopausal  Other Topics Concern  . Not on file  Social History Narrative  . Not on file   Social Determinants of Health   Financial Resource Strain: Not on file  Food Insecurity: Not on file  Transportation Needs: Not on file  Physical Activity: Not on file  Stress: Not on file  Social Connections: Not on file    Review of Systems  Constitutional: Negative for activity change and appetite change.  HENT: Negative for congestion and sinus pain.   Eyes: Negative for visual disturbance.  Respiratory: Negative for chest tightness and shortness of breath.   Cardiovascular: Negative for chest pain, palpitations and leg swelling.  Gastrointestinal: Negative for abdominal distention and abdominal pain.  Genitourinary: Negative for difficulty urinating and urgency.  Musculoskeletal:  Negative for arthralgias and back pain.  Neurological: Negative.      Objective:  BP 110/60   Pulse 65   Temp (!) 97.3 F (36.3 C)   Resp 16   Ht 5' 3.5" (1.613 m)   Wt 129 lb (58.5 kg)   BMI 22.49 kg/m   BP/Weight 03/16/2021 03/14/2021 46/96/2952  Systolic BP 841 324 401  Diastolic BP 73 60 82  Wt. (Lbs) - 129 128  BMI - 22.49 23.41    Physical Exam Vitals reviewed.  Constitutional:      Appearance: Normal appearance.  HENT:     Right Ear: Tympanic membrane normal.     Nose: Nose normal.   Cardiovascular:     Rate and Rhythm: Normal rate and regular rhythm.     Pulses: Normal pulses.     Heart sounds: Normal heart sounds. No murmur heard. No gallop.   Pulmonary:     Effort: Pulmonary effort is normal. No respiratory distress.     Breath sounds: Normal breath sounds. No rales.  Skin:    General: Skin is warm.     Comments: 4cm kerion -like scaling area.occiput, red and tender, she has one small lymph node  Neurological:     Mental Status: She is alert.       Lab Results  Component Value Date   WBC 7.6 10/10/2020   HGB 13.6 10/10/2020   HCT 41.1 10/10/2020   PLT 242 10/10/2020   GLUCOSE 92 10/10/2020   CHOL 197 10/10/2020   TRIG 64 10/10/2020   HDL 62 10/10/2020   LDLCALC 123 (H) 10/10/2020   ALT 16 10/10/2020   AST 23 10/10/2020   NA 140 10/10/2020   K 4.2 10/10/2020   CL 98 10/10/2020   CREATININE 0.63 10/10/2020   BUN 24 10/10/2020   CO2 28 10/10/2020   TSH 2.060 10/10/2020      Assessment & Plan:   Diagnoses and all orders for this visit: Cellulitis of head except face -     doxycycline (VIBRA-TABS) 100 MG tablet; Take 1 tablet (100 mg total) by mouth 2 (two) times daily. Patient has infection on occiput with scaling, treat with antibiotic.  We discussed possible kerion and need for antifungal       Follow-up: No follow-ups on file.  An After Visit Summary was printed and given to the patient.  Reinaldo Meeker, MD [facility] [phone]

## 2021-03-15 ENCOUNTER — Other Ambulatory Visit: Payer: Self-pay

## 2021-03-15 ENCOUNTER — Emergency Department (HOSPITAL_COMMUNITY)
Admission: EM | Admit: 2021-03-15 | Discharge: 2021-03-16 | Disposition: A | Discharge status: Home / Self Care | Payer: BC Managed Care – PPO | Attending: Emergency Medicine | Admitting: Emergency Medicine

## 2021-03-15 ENCOUNTER — Encounter (HOSPITAL_COMMUNITY): Payer: Self-pay

## 2021-03-15 DIAGNOSIS — R222 Localized swelling, mass and lump, trunk: Secondary | ICD-10-CM | POA: Diagnosis not present

## 2021-03-15 DIAGNOSIS — R220 Localized swelling, mass and lump, head: Secondary | ICD-10-CM | POA: Diagnosis not present

## 2021-03-15 DIAGNOSIS — B350 Tinea barbae and tinea capitis: Secondary | ICD-10-CM | POA: Diagnosis not present

## 2021-03-15 NOTE — ED Triage Notes (Signed)
Abscess to back of head x 1 week thats been getting bigger. Pt has seen PCP and started on doxycycline and feels she's getting much worse.

## 2021-03-15 NOTE — ED Provider Notes (Signed)
Emergency Medicine Provider Triage Evaluation Note  Michelle Watson , a 60 y.o. female  was evaluated in triage.  Pt complains of redness and swelling to the back of her head.  She states that this been ongoing for approximately 1 week feels that it is getting bigger and she was seen by PCP and started on doxycycline has taken 3 doses total because it is dosed twice daily so she took 2 doses yesterday and 1 dose today.  Denies any nausea or vomiting but does endorse feeling hot and having temperatures of approximately 100.  She feels that is worse than it has been previously.  Review of Systems  Positive: Swelling to the back of head, redness and pain Negative: Nausea vomiting  Physical Exam  BP 129/70 (BP Location: Right Arm)   Pulse 74   Temp 98.3 F (36.8 C) (Oral)   Resp 16   SpO2 100%  Gen:   Awake, no distress   Resp:  Normal effort  MSK:   Moves extremities without difficulty  Other:  Patient has red tender indurated area with scaling located to the back of the head.  Does have 1 swollen lymph node on the right side of her neck which appears to be the posterior left  Medical Decision Making  Medically screening exam initiated at 10:12 PM.  Appropriate orders placed.  Michelle Watson was informed that the remainder of the evaluation will be completed by another provider, this initial triage assessment does not replace that evaluation, and the importance of remaining in the ED until their evaluation is complete.  Patient understands importance of waiting in ER until evaluation is complete  Patient is here with redness and swelling to the back of her head.  Has only taken 3 doses of doxycycline total.  Feels that her symptoms are worsening.  Has no systemic symptoms apart from she states chills and reports temperature of 100.  Overall is nontoxic-appearing.  Anticipate patient will likely need to continue her antibiotics.   Pati Gallo [place], [place] 03/15/21 2219    Gareth Morgan, MD 03/18/21 1323

## 2021-03-16 DIAGNOSIS — R220 Localized swelling, mass and lump, head: Secondary | ICD-10-CM | POA: Diagnosis not present

## 2021-03-16 DIAGNOSIS — B350 Tinea barbae and tinea capitis: Secondary | ICD-10-CM | POA: Diagnosis not present

## 2021-03-16 MED ORDER — KETOCONAZOLE 2 % EX SHAM: 1.0000 "application " | MEDICATED_SHAMPOO | Freq: Every day | CUTANEOUS | 0 refills | Status: DC

## 2021-03-16 NOTE — Discharge Instructions (Addendum)
You were seen in the ER for lesion on your scalp  This looks like it is a kerion - fungus lesion.    Please continue taking your doxycycline - you should see response in 72 hours of antibiotics  For pain and inflammation you can use a combination of ibuprofen and acetaminophen.  Take [phone] mg acetaminophen (tylenol) every 6 hours or 600 mg ibuprofen (advil, motrin) every 6 hours.  You can take these separately or combine them every 6 hours for maximum pain control. Do not exceed 4,000 mg acetaminophen or 2,400 mg ibuprofen in a 24 hour period.  Do not take ibuprofen containing products if you have history of kidney disease, ulcers, GI bleeding, severe acid reflux, or take a blood thinner.  Do not take acetaminophen if you have liver disease.   I have prescribed ketoconazole 2% shampoo, please apply this daily on the lesion, let it dry and rinse with water.  Do this for 7 days, then use twice weekly.  If there is significant itching, you may take benadryl as needed.   Contact PCP and have a re-check in 48-72 hours if you see no improvement.  You may need a referral to a dermatologist.  If symptoms do not improve with the above regimen, you may need a biopsy or further testing as this lesion can be other things (seborrheic dermatitis, psoriasis, etc)   There is no evidence of abscess or boil and incision and drainage was not recommended.  Return for increased swelling, redness, warmth, fluctuance, pus drainage, fever greater than 100.4, chills, neck stiffness or swelling

## 2021-03-16 NOTE — ED Provider Notes (Signed)
[facility] EMERGENCY DEPARTMENT Provider Note   CSN: [id number] Arrival date & time: 03/15/21  2133     History Chief Complaint  Patient presents with  . Abscess    Michelle Watson is a 60 y.o. female with history of seborrheic dermatitis presents to the ER for evaluation of swelling to the back of her scalp for approximately 1 week.  Symptoms worsened on Wednesday.  Associated with warmth, tenderness. Has local swollen lymph nodes. Feels entire neck is sore.  Saw PCP 2 days ago who prescribed her doxycycline. She has taken 3 doses of this.  Has alternated ibuprofen and acetaminophen as well without pain relief.  Highest temperature 100 F. Uses OTC selium shampoo twice daily for seborrheic dermatitis on the scalp and salicylic acid shampoo daily.  Has not seen a dermatologist. No drainage from the area on the scalp. Concerned there lesion may need to be drained.   HPI     Past Medical History:  Diagnosis Date  . Migraine with aura    yellowish color seen  . Postmenopausal bleeding     Patient Active Problem List   Diagnosis Date Noted  . Postmenopausal bleeding 03/02/2016  . Migraine with aura     Past Surgical History:  Procedure Laterality Date  . DILATATION & CURETTAGE/HYSTEROSCOPY WITH MYOSURE N/A 05/06/2016   Procedure: DILATATION & CURETTAGE/HYSTEROSCOPY WITH MYOSURE;  Surgeon: Salvadore Dom, MD;  Location: [facility];  Service: Gynecology;  Laterality: N/A;  . INDUCED ABORTION    . LAPAROSCOPY N/A 05/06/2016   Procedure: LAPAROSCOPY OPERATIVE;  Surgeon: Salvadore Dom, MD;  Location: [facility];  Service: Gynecology;  Laterality: N/A;  RNFA NEEDED   . SALPINGOOPHORECTOMY Bilateral 05/06/2016   Procedure: Bilateral Salpingo oophorectomy;  Surgeon: Salvadore Dom, MD;  Location: [facility];  Service: Gynecology;  Laterality: Bilateral;     OB History    Gravida  1   Para      Term      Preterm      AB  1   Living        SAB       IAB      Ectopic      Multiple      Live Births              Family History  Problem Relation Age of Onset  . Hypertension Mother   . Osteoporosis Mother   . Dementia Mother   . Hypertension Father   . CVA Father   . Ovarian cancer Maternal Aunt     Social History   Tobacco Use  . Smoking status: Never Smoker  . Smokeless tobacco: Never Used  Vaping Use  . Vaping Use: Never used  Substance Use Topics  . Alcohol use: Yes    Alcohol/week: 2.0 standard drinks    Types: 2 Standard drinks or equivalent per week  . Drug use: No    Home Medications Prior to Admission medications   Medication Sig Start Date End Date Taking? Authorizing Provider  ketoconazole (NIZORAL) 2 % shampoo Apply 1 application topically daily. 03/16/21  Yes Kinnie Feil, PA-C  Calcium Citrate-Vitamin D (CITRACAL PETITES/VITAMIN D) 200-250 MG-UNIT TABS Take 2 tablets by mouth in the morning and at bedtime.    [provider]  clotrimazole-betamethasone (LOTRISONE) cream Apply 1 application topically 2 (two) times daily. 10/10/20   Cox, Elnita Maxwell, MD  doxycycline (VIBRA-TABS) 100 MG tablet Take 1 tablet (100 mg total) by  mouth 2 (two) times daily. 03/14/21   Lillard Anes, MD  loratadine (CLARITIN) 10 MG tablet Take 10 mg by mouth daily.    [provider]  polyethylene glycol powder (GLYCOLAX/MIRALAX) 17 GM/SCOOP powder Take 1 Container by mouth daily.    [provider]  triamcinolone (NASACORT) 55 MCG/ACT AERO nasal inhaler Place 2 sprays into the nose daily.    [provider]    Allergies    Tequin [gatifloxacin]  Review of Systems   Review of Systems  Skin:       Scalp lesion   All other systems reviewed and are negative.   Physical Exam Updated Vital Signs BP 128/71   Pulse 73   Temp 98.2 F (36.8 C) (Oral)   Resp 18   SpO2 100%   Physical Exam Constitutional:      Appearance: She is well-developed.  HENT:     Head:  Normocephalic.     Nose: Nose normal.  Eyes:     General: Lids are normal.  Neck:     Comments: Minimally tender right posterior cervical chain LAD, no overlaying erythema, warmth. No significant asymmetric neck edema. Mildly enlarged but non tender submandibular lymph nodes.  Cardiovascular:     Rate and Rhythm: Normal rate.  Pulmonary:     Effort: Pulmonary effort is normal. No respiratory distress.  Musculoskeletal:        General: Normal range of motion.     Cervical back: Normal range of motion.  Skin:    Findings: Lesion present.     Comments: Round boggy plaque with scaling, erythema, tenderness on occipital scalp, some hair loss. No pustular lesions. Some central bogginess but no significant fluctuance, white center.  No significant expanding erythema.   Neurological:     Mental Status: She is alert.  Psychiatric:        Behavior: Behavior normal.     ED Results / Procedures / Treatments   Labs (all labs ordered are listed, but only abnormal results are displayed) Labs Reviewed - No data to display  EKG None  Radiology No results found.  Procedures Procedures   Medications Ordered in ED Medications - No data to display  ED Course  I have reviewed the triage vital signs and the nursing notes.  Pertinent labs & imaging results that were available during my care of the patient were reviewed by me and considered in my medical decision making (see chart for details).    MDM Rules/Calculators/A&P                          EMR triage and nursing notes reviewed.  Encounter with PCP 5/20 reviewed.  Agree lesion looks most like kerion. Has classic boggy erythematous, scaly plaque with some hair loss.  I don't feel significant fluctuance to suggest superimposed abscess. I do not think emergent I&D is indicated. No fever. Vital signs normal. No significant cellulitis. Some reactive lymph nodes.  No other symptoms to suggest systemic illness, deep infection. I don't think  emergent labs, imaging is indicated.  Difficult to treat lesion from ED. Will try ketoconazole shampoo, anti histamine, continue high dose NSAIDs. Will defer steroids at this time.  Recommended PCP follow up in 72 hr if no significant improvement. Will need dermatology follow up - maybe biopsy.  Return precautions given. Questions and concerns addressed prior to discharge.  Patient in agreement with ER POC and disposition.  Final Clinical Impression(s) / ED  Diagnoses Final diagnoses:  Tinea kerion    Rx / DC Orders ED Discharge Orders         Ordered    ketoconazole (NIZORAL) 2 % shampoo  Daily        03/16/21 0830           Arlean Hopping 03/16/21 0849    Carmin Muskrat, MD 03/16/21 [phone]

## 2021-03-27 ENCOUNTER — Other Ambulatory Visit: Payer: Self-pay

## 2021-03-27 ENCOUNTER — Ambulatory Visit: Payer: BC Managed Care – PPO

## 2021-03-27 ENCOUNTER — Ambulatory Visit (INDEPENDENT_AMBULATORY_CARE_PROVIDER_SITE_OTHER): Payer: BC Managed Care – PPO | Admitting: Family Medicine

## 2021-03-27 VITALS — BP 110/60 | HR 76 | Temp 95.9°F | Resp 14 | Wt 125.0 lb

## 2021-03-27 DIAGNOSIS — H8113 Benign paroxysmal vertigo, bilateral: Secondary | ICD-10-CM | POA: Diagnosis not present

## 2021-03-27 DIAGNOSIS — R42 Dizziness and giddiness: Secondary | ICD-10-CM | POA: Diagnosis not present

## 2021-03-27 DIAGNOSIS — G43909 Migraine, unspecified, not intractable, without status migrainosus: Secondary | ICD-10-CM | POA: Diagnosis not present

## 2021-03-27 DIAGNOSIS — L219 Seborrheic dermatitis, unspecified: Secondary | ICD-10-CM

## 2021-03-27 DIAGNOSIS — R591 Generalized enlarged lymph nodes: Secondary | ICD-10-CM | POA: Diagnosis not present

## 2021-03-27 IMAGING — MG MM DIGITAL SCREENING BILAT W/ TOMO W/ CAD
8 of 14 series · 9 of 40 positions shown · non-contrast
Comparison: Previous exam(s).

CLINICAL DATA: Screening.

EXAM:
DIGITAL SCREENING BILATERAL MAMMOGRAM WITH TOMO AND CAD

[L CC synth-2D (1 of 2)]
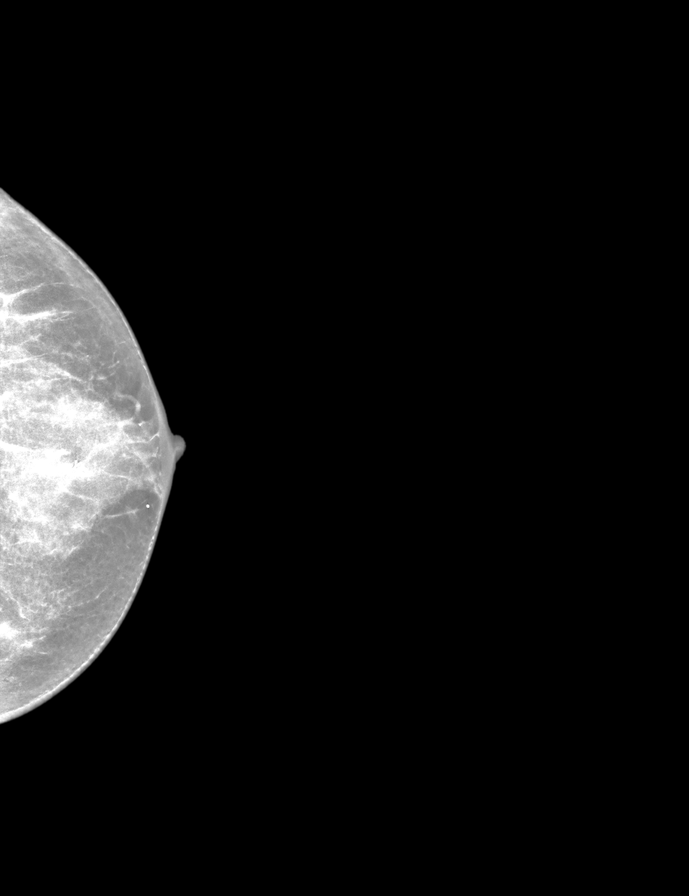

[L MLO synth-2D]
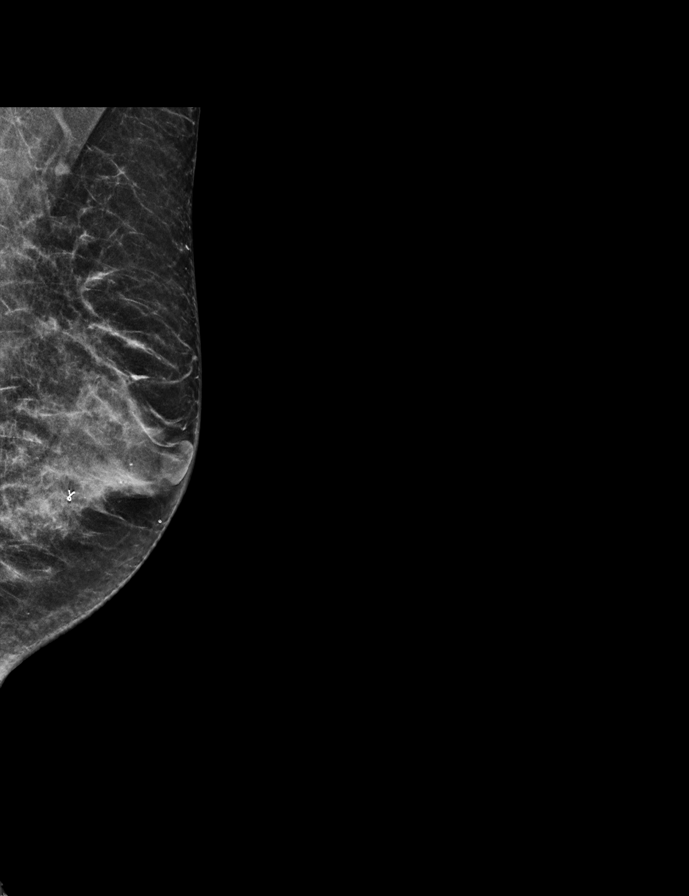

[R CC synth-2D (1 of 2)]
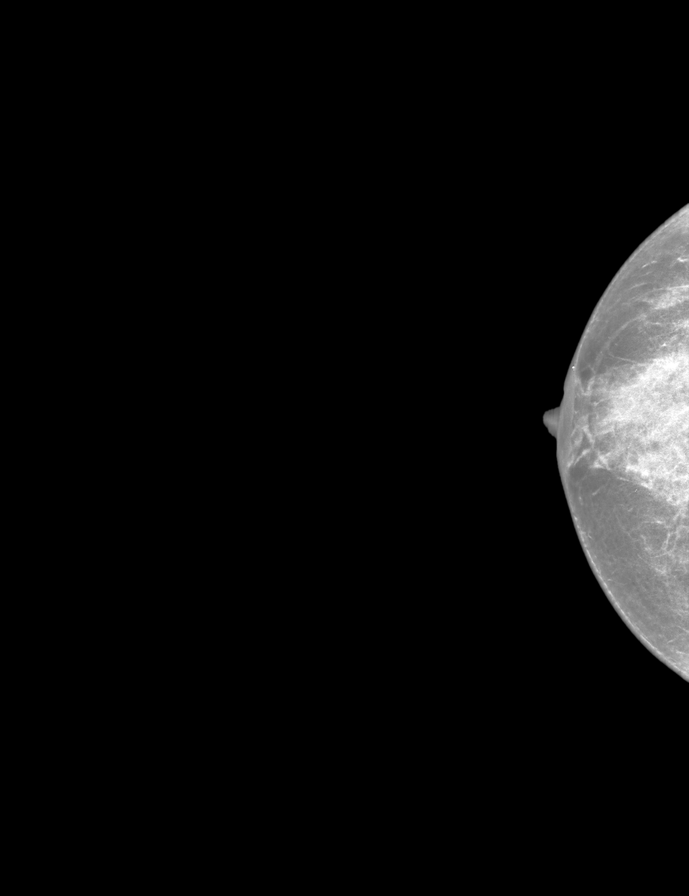

[L CC synth-2D (2 of 2)]
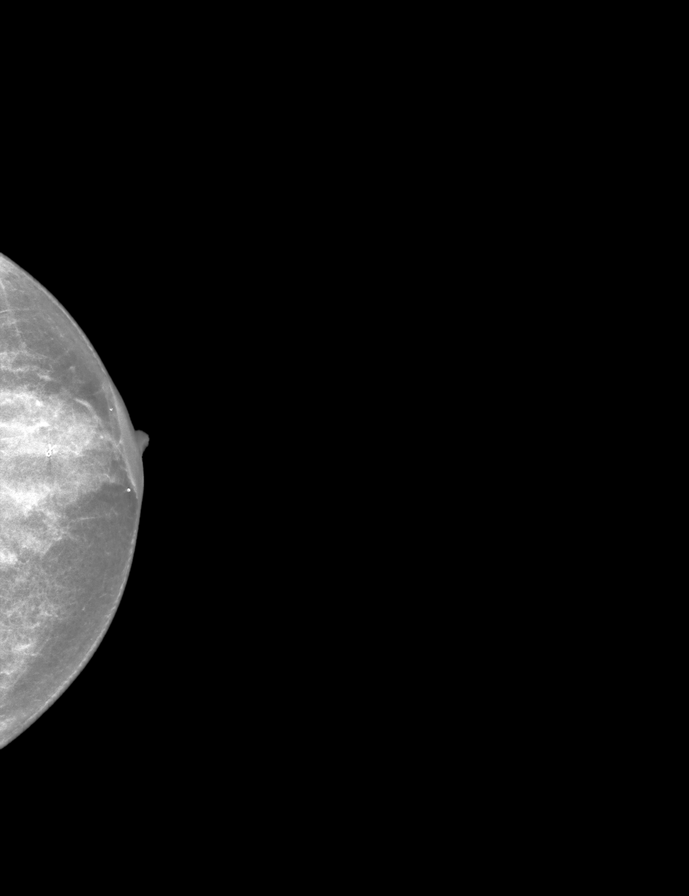

[R MLO synth-2D (1 of 2)]
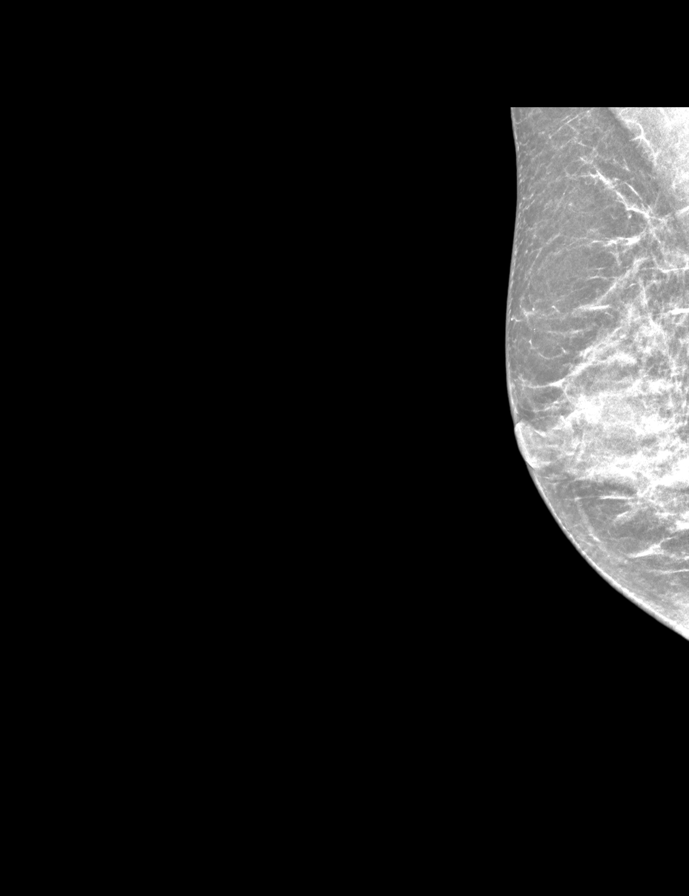

[R MLO synth-2D (2 of 2)]
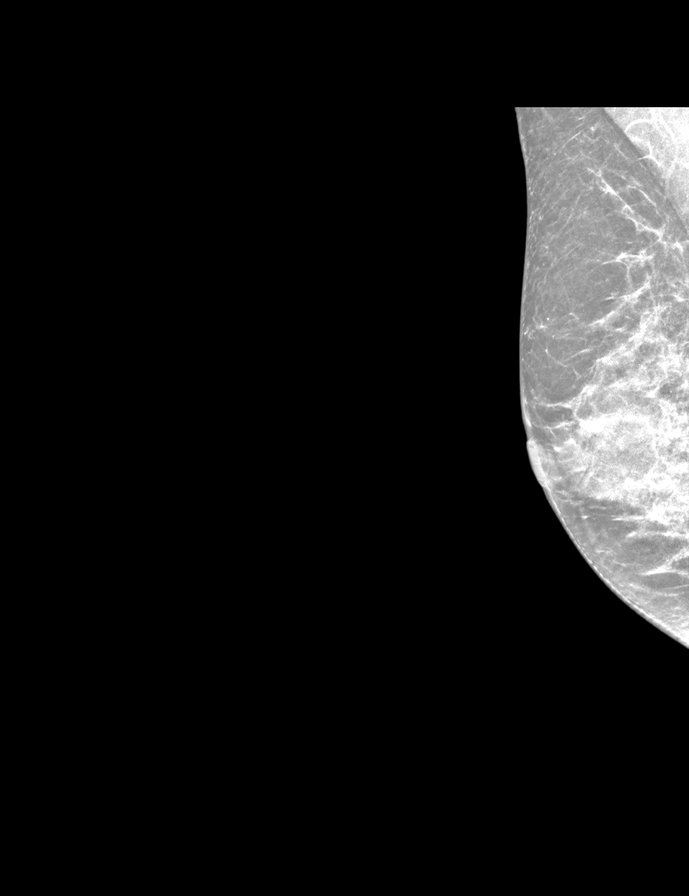

[R CC synth-2D (2 of 2)]
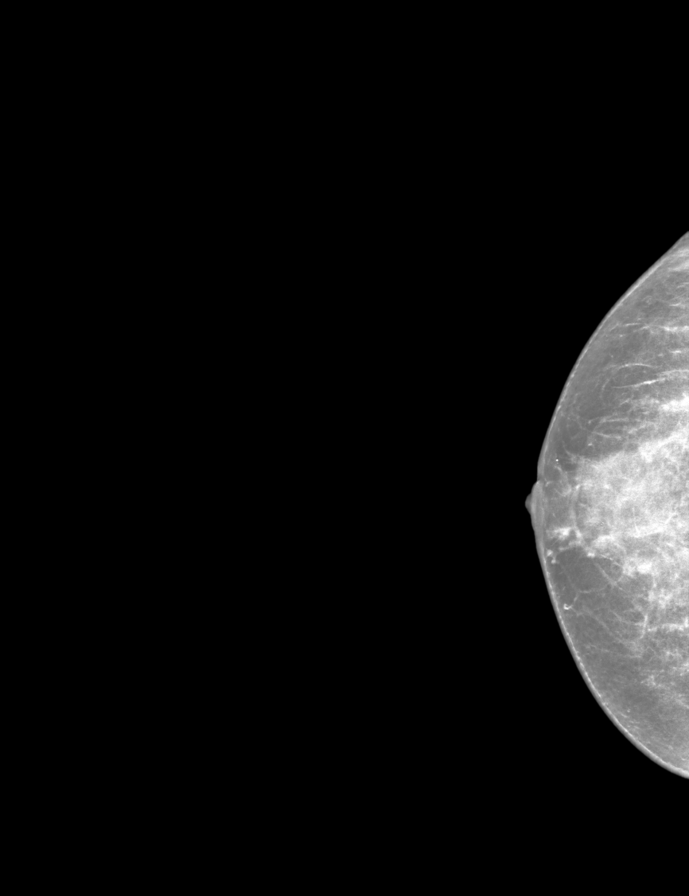

[R CC tomo · 2 of 50 frames shown]
[frame 17/50]
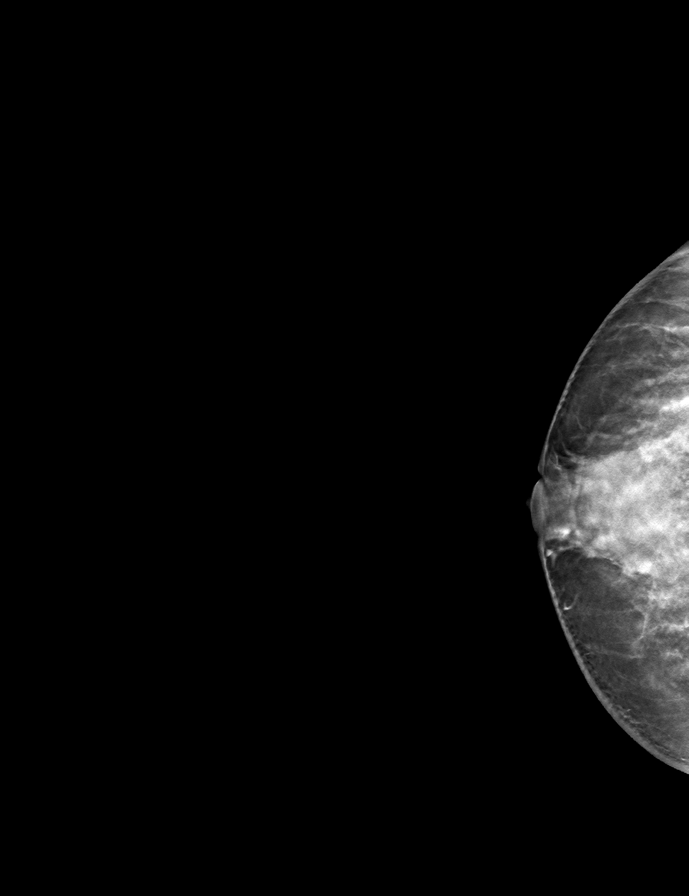
[frame 25/50]
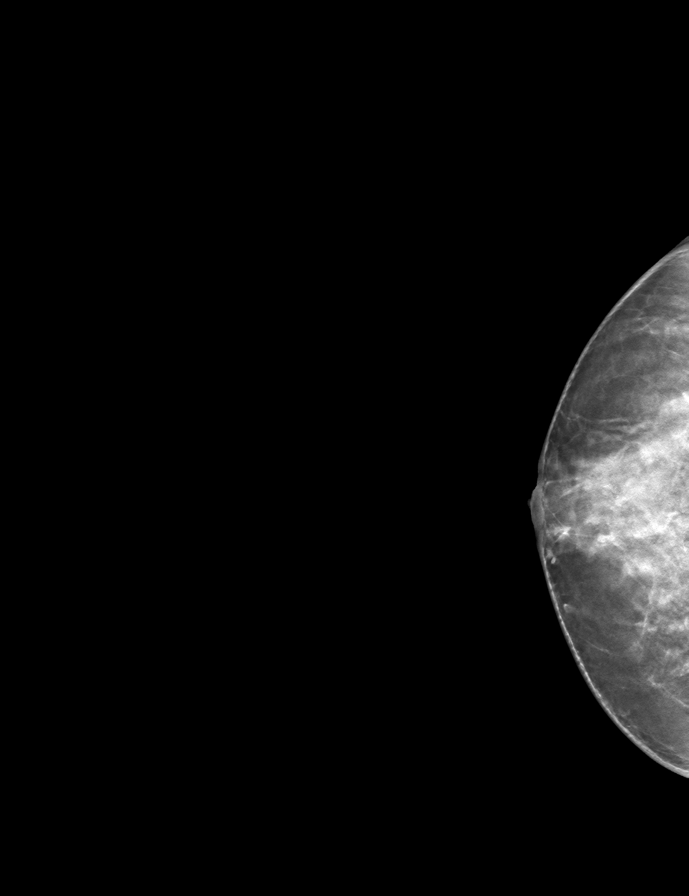

[9 of 40 positions shown; findings below may reference images not displayed]

ACR Breast Density Category c: The breast tissue is heterogeneously
dense, which may obscure small masses.
FINDINGS: There are no findings suspicious for malignancy. Images were
processed with CAD.
IMPRESSION: No mammographic evidence of malignancy. A result letter of this
screening mammogram will be mailed directly to the patient.

RECOMMENDATION:
Screening mammogram in one year. (Code:FT-U-LHB)

BI-RADS CATEGORY  1: Negative.

## 2021-03-27 NOTE — Patient Instructions (Signed)
Recommend bppv exercises.  Call back when would like to start statin.  Stay hydrated.  Roselyn Meier given in case migraine returns.

## 2021-03-27 NOTE — Progress Notes (Signed)
Acute Office Visit  Subjective:    Patient ID: Michelle Watson, female    DOB: 04-Nov-1960, 60 y.o.   MRN: [id number]  Chief Complaint  Patient presents with   Rash   Dizziness    HPI Patient is in today follow up from ED. She is complaining of Rash on posterior head and lymph node enlargement. Pt is lightheaded. No fevers or chills now.  The working diagnosis of this was seborrheic dermatitis complicated with a bacterial cellulitis/abscess. The patient was seen on January 12, 2021 here in our office by Dr. Henrene Pastor.  She had lymphadenopathy in her posterior scalp and anterior neck.  Dr. Henrene Pastor treated her with doxycycline.  After 2 days of this she was not improving and in fact was worsening.  She was taking Tylenol 3 g daily and nsaids at max doses and despite this was having severe pressure on her posterior head.  Her entire back of her head felt hot.  Her ears felt full.  She had severe muscle pain in her upper arms and legs.  She felt like she could not catch her breath and was getting winded easily.  She chose to go to [facility] ED on 03/16/2021.  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  She was truly concerned that there was a severe infection of some type going on and she was failing outpatient treatment. Unfortunately she had a very poor experience at the emergency department.  She waited for over 8 hours after initially being triaged.  No labs were completed.  The midlevel provider that she saw prescribed ketoconazole shampoo even though Dr. Rocky Link had clearly told the nurse that she was already using this.  She was not listened to, her chart was not reviewed by the provider, and essentially no exam was done. She was discharged home very dissatisfied and still very sick.  On Tuesday, Mar 18, 2021 the lesion on her posterior scalp began to drain.  The drainage resolved on May 24.  She completed the course of doxycycline.  She then developed a  migraine for 3 days.  She used Aleve and Tylenol for the migraine.  She started having benign positional vertigo symptoms associated with the migraine.  Since then she has been off balance. Tuesday began to drain. Drainage stopped on Thursday.  Then developed a migraine for 3 days. Migraine treatments with aleve, tylenol. BPPV symptoms with migraines. Since then has been off balance.  She is having problems with heat intolerance which normally she has cold intolerance as a rule.  She prefers to be in a cold room if at all possible.  Her driving is safe.  She continues to have a little bit of nausea. Left ear is still full. Pounds on top of head at times.  She increased rhinocort and taking loratadine.     Past Medical History:  Diagnosis Date   Migraine with aura    yellowish color seen   Postmenopausal bleeding     Past Surgical History:  Procedure Laterality Date   DILATATION & CURETTAGE/HYSTEROSCOPY WITH MYOSURE N/A 05/06/2016   Procedure: DILATATION & CURETTAGE/HYSTEROSCOPY WITH MYOSURE;  Surgeon: Salvadore Dom, MD;  Location: [facility];  Service: Gynecology;  Laterality: N/A;   INDUCED ABORTION     LAPAROSCOPY N/A 05/06/2016   Procedure: LAPAROSCOPY OPERATIVE;  Surgeon: Salvadore Dom, MD;  Location: [facility];  Service: Gynecology;  Laterality: N/A;  RNFA NEEDED  SALPINGOOPHORECTOMY Bilateral 05/06/2016   Procedure: Bilateral Salpingo oophorectomy;  Surgeon: Salvadore Dom, MD;  Location: [facility];  Service: Gynecology;  Laterality: Bilateral;    Family History  Problem Relation Age of Onset   Hypertension Mother    Osteoporosis Mother    Dementia Mother    Hypertension Father    CVA Father    Ovarian cancer Maternal Aunt     Social History   Socioeconomic History   Marital status: Married    Spouse name: Not on file   Number of children: 0   Years of education: Not on file   Highest education level: Not on file  Occupational History   Occupation: Physician    Tobacco Use   Smoking status: Never   Smokeless tobacco: Never  Vaping Use   Vaping Use: Never used  Substance and Sexual Activity   Alcohol use: Yes    Alcohol/week: 2.0 standard drinks    Types: 2 Standard drinks or equivalent per week   Drug use: No   Sexual activity: Not Currently    Partners: Male    Birth control/protection: Post-menopausal  Other Topics Concern   Not on file  Social History Narrative   Not on file   Social Determinants of Health   Financial Resource Strain: Not on file  Food Insecurity: Not on file  Transportation Needs: Not on file  Physical Activity: Not on file  Stress: Not on file  Social Connections: Not on file  Intimate Partner Violence: Not on file    Outpatient Medications Prior to Visit  Medication Sig Dispense Refill   Calcium Citrate-Vitamin D (CITRACAL PETITES/VITAMIN D) 200-250 MG-UNIT TABS Take 2 tablets by mouth in the morning and at bedtime.     clotrimazole-betamethasone (LOTRISONE) cream Apply 1 application topically 2 (two) times daily. 30 g 2   ketoconazole (NIZORAL) 2 % shampoo Apply 1 application topically daily. 120 mL 0   loratadine (CLARITIN) 10 MG tablet Take 10 mg by mouth daily.     polyethylene glycol powder (GLYCOLAX/MIRALAX) 17 GM/SCOOP powder Take 1 Container by mouth daily.     triamcinolone (NASACORT) 55 MCG/ACT AERO nasal inhaler Place 2 sprays into the nose daily.     doxycycline (VIBRA-TABS) 100 MG tablet Take 1 tablet (100 mg total) by mouth 2 (two) times daily. 20 tablet 0   No facility-administered medications prior to visit.    Allergies  Allergen Reactions   Tequin [Gatifloxacin] Other (See Comments)    Tendon pain    Review of Systems  Constitutional:  Negative for chills, fatigue and fever.  HENT:  Positive for ear pain. Negative for congestion and sore throat.   Respiratory:  Negative for cough, chest tightness and shortness of breath.   Cardiovascular:  Negative for chest pain.   Gastrointestinal:  Positive for nausea. Negative for abdominal pain, constipation, diarrhea and vomiting.  Endocrine: Positive for heat intolerance.  Genitourinary:  Negative for dysuria and urgency.  Musculoskeletal:  Positive for myalgias. Negative for arthralgias.  Skin:  Negative for rash.  Neurological:  Positive for dizziness. Negative for headaches.  Hematological:  Positive for adenopathy.  Psychiatric/Behavioral:  Negative for dysphoric mood. The patient is not nervous/anxious.       Objective:    Physical Exam Vitals reviewed.  Constitutional:      Appearance: Normal appearance. She is normal weight.  HENT:     Right Ear: Tympanic membrane, ear canal and external ear normal.     Left Ear: Tympanic membrane,  ear canal and external ear normal.     Nose: Nose normal.     Mouth/Throat:     Pharynx: Oropharynx is clear.  Cardiovascular:     Rate and Rhythm: Normal rate and regular rhythm.     Heart sounds: Normal heart sounds. No murmur heard. Pulmonary:     Effort: Pulmonary effort is normal. No respiratory distress.     Breath sounds: Normal breath sounds.  Abdominal:     General: Abdomen is flat. Bowel sounds are normal.     Palpations: Abdomen is soft.     Tenderness: There is no abdominal tenderness.  Lymphadenopathy:     Cervical: Cervical adenopathy (BL. Rt > LT. decreasing in size based on Dr.Ertle's history) present.  Skin:    Findings: Rash (discoloration under hair posteriorly. nontender. no scaling.) present.  Neurological:     General: No focal deficit present.     Mental Status: She is alert and oriented to person, place, and time.     Comments: Scientist, forensic showed nystagmus BL  Psychiatric:        Mood and Affect: Mood normal.        Behavior: Behavior normal.    BP 110/60   Pulse 76   Temp (!) 95.9 F (35.5 C)   Resp 14   Wt 125 lb (56.7 kg)   BMI 21.80 kg/m  Wt Readings from Last 3 Encounters:  03/27/21 125 lb (56.7 kg)   03/14/21 129 lb (58.5 kg)  10/10/20 128 lb (58.1 kg)    Health Maintenance Due  Topic Date Due   HIV Screening  Never done   Hepatitis C Screening  Never done   TETANUS/TDAP  10/26/2018   COVID-19 Vaccine (4 - Booster for Pfizer series) 11/26/2020    There are no preventive care reminders to display for this patient.   Lab Results  Component Value Date   TSH 2.060 10/10/2020   Lab Results  Component Value Date   WBC 10.6 03/27/2021   HGB 13.4 03/27/2021   HCT 41.5 03/27/2021   MCV 95 03/27/2021   PLT 267 03/27/2021   Lab Results  Component Value Date   NA 140 03/27/2021   K 3.7 03/27/2021   CO2 27 03/27/2021   GLUCOSE 100 (H) 03/27/2021   BUN 26 03/27/2021   CREATININE 0.67 03/27/2021   BILITOT 0.2 03/27/2021   ALKPHOS 64 03/27/2021   AST 21 03/27/2021   ALT 31 03/27/2021   PROT 7.5 03/27/2021   ALBUMIN 5.0 (H) 03/27/2021   CALCIUM 10.0 03/27/2021   ANIONGAP 6 04/22/2016   EGFR 100 03/27/2021   Lab Results  Component Value Date   CHOL 197 10/10/2020   Lab Results  Component Value Date   HDL 62 10/10/2020   Lab Results  Component Value Date   LDLCALC 123 (H) 10/10/2020   Lab Results  Component Value Date   TRIG 64 10/10/2020   Lab Results  Component Value Date   CHOLHDL 3.2 10/10/2020   No results found for: HGBA1C     Assessment & Plan:  1. Dizziness: likely all secondary to BPPV, but still recommend stay hydrated.  - Comprehensive metabolic panel  2. Lymphadenopathy of head and neck I do not believe pt needs another antibiotic at this time.  - CBC with Differential/Platelet  3. BPPV (benign paroxysmal positional vertigo), bilateral Recommended bppv exercises. Pt is knowledgeable.   4. Migraine without status migrainosus, not intractable, unspecified migraine type Resolved. Ubrelvy samples  given in case migraine returns.  5. Seborrheic dermatitis  Resolved. Has ketaconazole shampoo if needed.   Orders Placed This Encounter   Procedures   Comprehensive metabolic panel   CBC with Differential/Platelet     Follow-up: No follow-ups on file.  An After Visit Summary was printed and given to the patient.  Rochel Brome, MD [facility] [phone]

## 2021-03-28 LAB — CBC WITH DIFFERENTIAL/PLATELET
Basophils Absolute: 0.1 10*3/uL (ref 0.0–0.2)
Basos: 1 %
EOS (ABSOLUTE): 0 10*3/uL (ref 0.0–0.4)
Eos: 0 %
Hematocrit: 41.5 % (ref 34.0–46.6)
Hemoglobin: 13.4 g/dL (ref 11.1–15.9)
Immature Grans (Abs): 0 10*3/uL (ref 0.0–0.1)
Immature Granulocytes: 0 %
Lymphocytes Absolute: 2.2 10*3/uL (ref 0.7–3.1)
Lymphs: 21 %
MCH: 30.6 pg (ref 26.6–33.0)
MCHC: 32.3 g/dL (ref 31.5–35.7)
MCV: 95 fL (ref 79–97)
Monocytes Absolute: 0.4 10*3/uL (ref 0.1–0.9)
Monocytes: 4 %
Neutrophils Absolute: 7.8 10*3/uL — ABNORMAL HIGH (ref 1.4–7.0)
Neutrophils: 74 %
Platelets: 267 10*3/uL (ref 150–450)
RBC: 4.38 x10E6/uL (ref 3.77–5.28)
RDW: 12.3 % (ref 11.7–15.4)
WBC: 10.6 10*3/uL (ref 3.4–10.8)

## 2021-03-28 LAB — COMPREHENSIVE METABOLIC PANEL
ALT: 31 IU/L (ref 0–32)
AST: 21 IU/L (ref 0–40)
Albumin/Globulin Ratio: 2 (ref 1.2–2.2)
Albumin: 5 g/dL — ABNORMAL HIGH (ref 3.8–4.9)
Alkaline Phosphatase: 64 IU/L (ref 44–121)
BUN/Creatinine Ratio: 39 — ABNORMAL HIGH (ref 12–28)
BUN: 26 mg/dL (ref 8–27)
Bilirubin Total: 0.2 mg/dL (ref 0.0–1.2)
CO2: 27 mmol/L (ref 20–29)
Calcium: 10 mg/dL (ref 8.7–10.3)
Chloride: 99 mmol/L (ref 96–106)
Creatinine, Ser: 0.67 mg/dL (ref 0.57–1.00)
Globulin, Total: 2.5 g/dL (ref 1.5–4.5)
Glucose: 100 mg/dL — ABNORMAL HIGH (ref 65–99)
Potassium: 3.7 mmol/L (ref 3.5–5.2)
Sodium: 140 mmol/L (ref 134–144)
Total Protein: 7.5 g/dL (ref 6.0–8.5)
eGFR: 100 mL/min/{1.73_m2} (ref >59)

## 2021-04-06 ENCOUNTER — Encounter: Payer: Self-pay | Admitting: Family Medicine

## 2021-04-21 ENCOUNTER — Ambulatory Visit: Payer: BC Managed Care – PPO | Admitting: Family Medicine

## 2021-06-16 ENCOUNTER — Other Ambulatory Visit: Payer: Self-pay | Admitting: Family Medicine

## 2021-06-16 DIAGNOSIS — Z1231 Encounter for screening mammogram for malignant neoplasm of breast: Secondary | ICD-10-CM

## 2021-07-01 ENCOUNTER — Other Ambulatory Visit: Payer: Self-pay | Admitting: Family Medicine

## 2021-07-01 DIAGNOSIS — Z1231 Encounter for screening mammogram for malignant neoplasm of breast: Secondary | ICD-10-CM

## 2021-08-06 DIAGNOSIS — H2513 Age-related nuclear cataract, bilateral: Secondary | ICD-10-CM | POA: Diagnosis not present

## 2021-08-06 DIAGNOSIS — H43813 Vitreous degeneration, bilateral: Secondary | ICD-10-CM | POA: Diagnosis not present

## 2021-08-06 DIAGNOSIS — H5213 Myopia, bilateral: Secondary | ICD-10-CM | POA: Diagnosis not present

## 2021-08-06 DIAGNOSIS — H524 Presbyopia: Secondary | ICD-10-CM | POA: Diagnosis not present

## 2021-08-15 ENCOUNTER — Ambulatory Visit (INDEPENDENT_AMBULATORY_CARE_PROVIDER_SITE_OTHER): Payer: BC Managed Care – PPO

## 2021-08-15 DIAGNOSIS — Z23 Encounter for immunization: Secondary | ICD-10-CM

## 2021-08-18 ENCOUNTER — Other Ambulatory Visit: Payer: Self-pay

## 2021-08-18 ENCOUNTER — Ambulatory Visit
Admission: RE | Admit: 2021-08-18 | Discharge: 2021-08-18 | Disposition: A | Discharge status: Home / Self Care | Payer: BC Managed Care – PPO | Source: Ambulatory Visit | Attending: Family Medicine | Admitting: Family Medicine

## 2021-08-18 DIAGNOSIS — Z1231 Encounter for screening mammogram for malignant neoplasm of breast: Secondary | ICD-10-CM | POA: Diagnosis not present

## 2021-10-13 ENCOUNTER — Ambulatory Visit (INDEPENDENT_AMBULATORY_CARE_PROVIDER_SITE_OTHER): Payer: BC Managed Care – PPO | Admitting: Family Medicine

## 2021-10-13 ENCOUNTER — Encounter: Payer: Self-pay | Admitting: Family Medicine

## 2021-10-13 ENCOUNTER — Other Ambulatory Visit: Payer: Self-pay

## 2021-10-13 VITALS — BP 130/82 | HR 72 | Temp 98.2°F | Ht 63.5 in | Wt 122.0 lb

## 2021-10-13 DIAGNOSIS — Z6822 Body mass index (BMI) 22.0-22.9, adult: Secondary | ICD-10-CM | POA: Insufficient documentation

## 2021-10-13 DIAGNOSIS — Z23 Encounter for immunization: Secondary | ICD-10-CM | POA: Diagnosis not present

## 2021-10-13 DIAGNOSIS — Z0000 Encounter for general adult medical examination without abnormal findings: Secondary | ICD-10-CM | POA: Insufficient documentation

## 2021-10-13 NOTE — Patient Instructions (Addendum)
Things to do to keep yourself healthy  - Exercise at least 30-45 minutes a day, 3-4 days a week.  - Eat a low-fat diet with lots of fruits and vegetables, up to 7-9 servings per day.  - Seatbelts can save your life. Wear them always.  - Smoke detectors on every level of your home, check batteries every year.  - Eye Doctor - have an eye exam every 1-2 years  - Alcohol -  If you drink, do it moderately, less than 2 drinks per day.  - [place]. Choose someone to speak for you if you are not able.  - Depression is common in our stressful world.If you're feeling down or losing interest in things you normally enjoy, please come in for a visit.  Let me know if you would like cologuard. Call when you need your mammogram scheduled.  Merry Christmas!

## 2021-10-13 NOTE — Assessment & Plan Note (Signed)
Things to do to keep yourself healthy  - Exercise at least 30-45 minutes a day, 3-4 days a week.  - Eat a low-fat diet with lots of fruits and vegetables, up to 7-9 servings per day.  - Seatbelts can save your life. Wear them always.  - Smoke detectors on every level of your home, check batteries every year.  - Eye Doctor - have an eye exam every 1-2 years  - Alcohol -  If you drink, do it moderately, less than 2 drinks per day.  - [place]. Choose someone to speak for you if you are not able.  - Depression is common in our stressful world.If you're feeling down or losing interest in things you normally enjoy, please come in for a visit.  Let me know if you would like cologuard. Call when you need your mammogram scheduled.

## 2021-10-13 NOTE — Progress Notes (Deleted)
duplicate

## 2021-10-13 NOTE — Progress Notes (Signed)
Subjective:  Patient ID: Michelle Watson, female    DOB: 10-07-1961  Age: 60 y.o. MRN: [id number]  Chief Complaint  Patient presents with   Annual Exam    HPI Well Adult Physical: Patient here for a comprehensive physical exam.The patient reports problems - occasional vertigo. Brief. Impacted right upper wisdom tooth requiring surgery in January 2023. Due for colon cancer screening in 2023. Considering cologuard vs colonoscopy. Would like to discuss. Need tdap.  Do you take any herbs or supplements that were not prescribed by a doctor? no Are you taking calcium supplements? yes Are you taking aspirin daily? no  Physical ("At Risk" items are starred): Patient's last physical exam was 1 year ago .  Safety: reviewed ; Patient wears a seat belt, has smoke detectors, has carbon monoxide detectors, practices appropriate gun safety, and wears sunscreen with extended sun exposure. Dental Care: biannual cleanings, brushes and flosses daily. Ophthalmology/Optometry: Annual visit.  Hearing loss: none Vision impairments: none  No flowsheet data found.   Depression screen [facility] 2/9 10/13/2021 10/10/2020  Decreased Interest 0 0  Down, Depressed, Hopeless 0 0  PHQ - 2 Score 0 0      Health Maintenance  Topic Date Due   HIV Screening  Never done   Hepatitis C Screening: USPSTF Recommendation to screen - Ages 33-79 yo.  Never done   Pap Smear  07/05/2022   Colon Cancer Screening  09/05/2022   Mammogram  08/19/2023   Tetanus Vaccine  10/14/2031   Flu Shot  Completed   COVID-19 Vaccine  Completed   Zoster (Shingles) Vaccine  Completed   Pneumococcal Vaccination  Aged Out   HPV Vaccine  Aged Out     Social Hx   Social History   Socioeconomic History   Marital status: Married    Spouse name: Not on file   Number of children: 0   Years of education: Not on file   Highest education level: Not on file  Occupational History   Occupation: Physician   Tobacco Use   Smoking status: Never    Smokeless tobacco: Never  Vaping Use   Vaping Use: Never used  Substance and Sexual Activity   Alcohol use: Yes    Alcohol/week: 2.0 standard drinks    Types: 2 Standard drinks or equivalent per week   Drug use: No   Sexual activity: Not Currently    Partners: Male    Birth control/protection: Post-menopausal  Other Topics Concern   Not on file  Social History Narrative   Not on file   Social Determinants of Health   Financial Resource Strain: Not on file  Food Insecurity: Not on file  Transportation Needs: Not on file  Physical Activity: Not on file  Stress: Not on file  Social Connections: Not on file   Past Medical History:  Diagnosis Date   Migraine with aura    yellowish color seen   Postmenopausal bleeding    Family History  Problem Relation Age of Onset   Hypertension Mother    Osteoporosis Mother    Dementia Mother    Hypertension Father    CVA Father    Ovarian cancer Maternal Aunt    Breast cancer Other     Review of Systems  Constitutional:  Negative for chills, fatigue and fever.  HENT:  Negative for congestion, ear pain and sore throat.   Respiratory:  Negative for cough and shortness of breath.   Cardiovascular:  Negative for chest pain.  Gastrointestinal:  Negative for abdominal pain, constipation, diarrhea, nausea and vomiting.  Genitourinary:  Negative for dysuria and urgency.  Musculoskeletal:  Negative for arthralgias and myalgias.  Skin:  Negative for rash.  Neurological:  Negative for dizziness and headaches.  Psychiatric/Behavioral:  Negative for dysphoric mood. The patient is not nervous/anxious.     Objective:  BP 130/82 (BP Location: Right Arm, Patient Position: Sitting)    Pulse 72    Temp 98.2 F (36.8 C) (Temporal)    Ht 5' 3.5" (1.613 m)    Wt 122 lb (55.3 kg)    SpO2 95%    BMI 21.27 kg/m   BP/Weight 10/13/2021 03/27/2021 0/63/0160  Systolic BP 109 323 557  Diastolic BP 82 60 73  Wt. (Lbs) 122 125 -  BMI 21.27 21.8 -     Physical Exam Vitals reviewed.  Constitutional:      General: She is not in acute distress.    Appearance: Normal appearance. She is normal weight.  HENT:     Right Ear: Tympanic membrane and ear canal normal.     Left Ear: Tympanic membrane and ear canal normal.     Nose: Nose normal. No congestion or rhinorrhea.  Eyes:     Conjunctiva/sclera: Conjunctivae normal.  Neck:     Thyroid: No thyroid mass.     Vascular: No carotid bruit.  Cardiovascular:     Rate and Rhythm: Normal rate and regular rhythm.     Pulses: Normal pulses.     Heart sounds: Normal heart sounds. No murmur heard. Pulmonary:     Effort: Pulmonary effort is normal.     Breath sounds: Normal breath sounds.  Abdominal:     General: Bowel sounds are normal.     Palpations: Abdomen is soft. There is no mass.     Tenderness: There is no abdominal tenderness.  Skin:    General: Skin is warm and dry.     Findings: No lesion.  Neurological:     Mental Status: She is alert and oriented to person, place, and time.     Cranial Nerves: No cranial nerve deficit.  Psychiatric:        Mood and Affect: Mood normal.        Behavior: Behavior normal.    Lab Results  Component Value Date   WBC 5.8 10/13/2021   HGB 13.7 10/13/2021   HCT 40.5 10/13/2021   PLT 211 10/13/2021   GLUCOSE 88 10/13/2021   CHOL 207 (H) 10/13/2021   TRIG 76 10/13/2021   HDL 63 10/13/2021   LDLCALC 130 (H) 10/13/2021   ALT 14 10/13/2021   AST 21 10/13/2021   NA 142 10/13/2021   K 4.2 10/13/2021   CL 99 10/13/2021   CREATININE 0.71 10/13/2021   BUN 21 10/13/2021   CO2 28 10/13/2021   TSH 1.690 10/13/2021      Assessment & Plan:   Problem List Items Addressed This Visit       Other   General medical exam - Primary    Things to do to keep yourself healthy  - Exercise at least 30-45 minutes a day, 3-4 days a week.  - Eat a low-fat diet with lots of fruits and vegetables, up to 7-9 servings per day.  - Seatbelts can save  your life. Wear them always.  - Smoke detectors on every level of your home, check batteries every year.  - Eye Doctor - have an eye exam every 1-2 years  - Alcohol -  If you drink, do it moderately, less than 2 drinks per day.  - Rossie. Choose someone to speak for you if you are not able.  - Depression is common in our stressful world.If you're feeling down or losing interest in things you normally enjoy, please come in for a visit.  Let me know if you would like cologuard. Call when you need your mammogram scheduled.       Relevant Orders   CBC with Differential/Platelet (Completed)   Comprehensive metabolic panel (Completed)   Lipid panel (Completed)   TSH (Completed)   BMI 22.0-22.9, adult   Need for diphtheria-tetanus-pertussis (Tdap) vaccine   Relevant Orders   Tdap vaccine greater than or equal to 7yo IM (Completed)     No orders of the defined types were placed in this encounter.   This is a list of the screening recommended for you and due dates:  Health Maintenance  Topic Date Due   HIV Screening  Never done   Hepatitis C Screening: USPSTF Recommendation to screen - Ages 108-79 yo.  Never done   Pap Smear  07/05/2022   Colon Cancer Screening  09/05/2022   Mammogram  08/19/2023   Tetanus Vaccine  10/14/2031   Flu Shot  Completed   COVID-19 Vaccine  Completed   Zoster (Shingles) Vaccine  Completed   Pneumococcal Vaccination  Aged Out   HPV Vaccine  Aged Out     Follow-up: Return in about 1 year (around 10/13/2022) for cpe fasting.  Rochel Brome, MD [facility] [phone]

## 2021-10-14 LAB — COMPREHENSIVE METABOLIC PANEL
ALT: 14 IU/L (ref 0–32)
AST: 21 IU/L (ref 0–40)
Albumin/Globulin Ratio: 2.3 — ABNORMAL HIGH (ref 1.2–2.2)
Albumin: 5.2 g/dL — ABNORMAL HIGH (ref 3.8–4.8)
Alkaline Phosphatase: 67 IU/L (ref 44–121)
BUN/Creatinine Ratio: 30 — ABNORMAL HIGH (ref 12–28)
BUN: 21 mg/dL (ref 8–27)
Bilirubin Total: 0.4 mg/dL (ref 0.0–1.2)
CO2: 28 mmol/L (ref 20–29)
Calcium: 9.6 mg/dL (ref 8.7–10.3)
Chloride: 99 mmol/L (ref 96–106)
Creatinine, Ser: 0.71 mg/dL (ref 0.57–1.00)
Globulin, Total: 2.3 g/dL (ref 1.5–4.5)
Glucose: 88 mg/dL (ref 70–99)
Potassium: 4.2 mmol/L (ref 3.5–5.2)
Sodium: 142 mmol/L (ref 134–144)
Total Protein: 7.5 g/dL (ref 6.0–8.5)
eGFR: 97 mL/min/{1.73_m2} (ref >59)

## 2021-10-14 LAB — LIPID PANEL
Chol/HDL Ratio: 3.3 ratio (ref 0.0–4.4)
Cholesterol, Total: 207 mg/dL — ABNORMAL HIGH (ref 100–199)
HDL: 63 mg/dL (ref >39)
LDL Chol Calc (NIH): 130 mg/dL — ABNORMAL HIGH (ref 0–99)
Triglycerides: 76 mg/dL (ref 0–149)
VLDL Cholesterol Cal: 14 mg/dL (ref 5–40)

## 2021-10-14 LAB — CBC WITH DIFFERENTIAL/PLATELET
Basophils Absolute: 0.1 10*3/uL (ref 0.0–0.2)
Basos: 1 %
EOS (ABSOLUTE): 0 10*3/uL (ref 0.0–0.4)
Eos: 0 %
Hematocrit: 40.5 % (ref 34.0–46.6)
Hemoglobin: 13.7 g/dL (ref 11.1–15.9)
Immature Grans (Abs): 0 10*3/uL (ref 0.0–0.1)
Immature Granulocytes: 0 %
Lymphocytes Absolute: 1.6 10*3/uL (ref 0.7–3.1)
Lymphs: 28 %
MCH: 31.9 pg (ref 26.6–33.0)
MCHC: 33.8 g/dL (ref 31.5–35.7)
MCV: 94 fL (ref 79–97)
Monocytes Absolute: 0.3 10*3/uL (ref 0.1–0.9)
Monocytes: 5 %
Neutrophils Absolute: 3.9 10*3/uL (ref 1.4–7.0)
Neutrophils: 66 %
Platelets: 211 10*3/uL (ref 150–450)
RBC: 4.3 x10E6/uL (ref 3.77–5.28)
RDW: 12.3 % (ref 11.7–15.4)
WBC: 5.8 10*3/uL (ref 3.4–10.8)

## 2021-10-14 LAB — TSH: TSH: 1.69 u[IU]/mL (ref 0.450–4.500)

## 2021-10-14 NOTE — Progress Notes (Signed)
Blood count normal.  Liver function normal.  Kidney function normal.  Thyroid function normal.  Cholesterol: LDL 130. Consider statin: let me know if you would like to start on one and if you have a preference which one. I like crestor.

## 2021-10-30 HISTORY — PX: WISDOM TOOTH EXTRACTION: SHX21

## 2022-07-30 ENCOUNTER — Other Ambulatory Visit: Payer: Self-pay | Admitting: Family Medicine

## 2022-07-30 DIAGNOSIS — Z1231 Encounter for screening mammogram for malignant neoplasm of breast: Secondary | ICD-10-CM

## 2022-08-03 ENCOUNTER — Encounter: Payer: Self-pay | Admitting: Gastroenterology

## 2022-08-06 DIAGNOSIS — Z23 Encounter for immunization: Secondary | ICD-10-CM | POA: Diagnosis not present

## 2022-08-11 DIAGNOSIS — H2513 Age-related nuclear cataract, bilateral: Secondary | ICD-10-CM | POA: Diagnosis not present

## 2022-08-11 DIAGNOSIS — H524 Presbyopia: Secondary | ICD-10-CM | POA: Diagnosis not present

## 2022-08-11 DIAGNOSIS — H43813 Vitreous degeneration, bilateral: Secondary | ICD-10-CM | POA: Diagnosis not present

## 2022-08-11 DIAGNOSIS — H5213 Myopia, bilateral: Secondary | ICD-10-CM | POA: Diagnosis not present

## 2022-08-11 DIAGNOSIS — H52203 Unspecified astigmatism, bilateral: Secondary | ICD-10-CM | POA: Diagnosis not present

## 2022-08-19 ENCOUNTER — Ambulatory Visit
Admission: RE | Admit: 2022-08-19 | Discharge: 2022-08-19 | Disposition: A | Discharge status: Home / Self Care | Payer: BC Managed Care – PPO | Source: Ambulatory Visit | Attending: Family Medicine | Admitting: Family Medicine

## 2022-08-19 DIAGNOSIS — Z1231 Encounter for screening mammogram for malignant neoplasm of breast: Secondary | ICD-10-CM | POA: Diagnosis not present

## 2022-09-02 ENCOUNTER — Ambulatory Visit (AMBULATORY_SURGERY_CENTER): Payer: Self-pay

## 2022-09-02 VITALS — Ht 63.0 in | Wt 126.0 lb

## 2022-09-02 DIAGNOSIS — Z1211 Encounter for screening for malignant neoplasm of colon: Secondary | ICD-10-CM

## 2022-09-02 MED ORDER — NA SULFATE-K SULFATE-MG SULF 17.5-3.13-1.6 GM/177ML PO SOLN: 1.0000 | Freq: Once | ORAL | 0 refills | Status: AC

## 2022-09-02 NOTE — Progress Notes (Signed)

## 2022-09-10 ENCOUNTER — Encounter: Payer: Self-pay | Admitting: Family Medicine

## 2022-09-25 ENCOUNTER — Encounter: Payer: BC Managed Care – PPO | Admitting: Gastroenterology

## 2022-10-06 ENCOUNTER — Encounter: Payer: Self-pay | Admitting: Gastroenterology

## 2022-10-08 ENCOUNTER — Other Ambulatory Visit: Payer: Self-pay | Admitting: Family Medicine

## 2022-10-08 ENCOUNTER — Encounter: Payer: Self-pay | Admitting: Family Medicine

## 2022-10-08 DIAGNOSIS — Z0000 Encounter for general adult medical examination without abnormal findings: Secondary | ICD-10-CM

## 2022-10-08 DIAGNOSIS — E782 Mixed hyperlipidemia: Secondary | ICD-10-CM

## 2022-10-12 ENCOUNTER — Encounter: Payer: Self-pay | Admitting: Gastroenterology

## 2022-10-12 ENCOUNTER — Ambulatory Visit (AMBULATORY_SURGERY_CENTER): Payer: BC Managed Care – PPO | Admitting: Gastroenterology

## 2022-10-12 VITALS — BP 116/60 | HR 57 | Temp 96.6°F | Resp 10 | Ht 63.0 in | Wt 126.0 lb

## 2022-10-12 DIAGNOSIS — Z1211 Encounter for screening for malignant neoplasm of colon: Secondary | ICD-10-CM

## 2022-10-12 HISTORY — PX: COLONOSCOPY: SHX174

## 2022-10-12 MED ORDER — SODIUM CHLORIDE 0.9 % IV SOLN: 500.0000 mL | Freq: Once | INTRAVENOUS | Status: DC

## 2022-10-12 NOTE — Progress Notes (Signed)
[facility] History and Physical   Primary Care Physician:  Rochel Brome, MD   Reason for Procedure:   CRC  screening  Plan:    colon     HPI: Michelle Watson is a 61 y.o. female  No nausea, vomiting, heartburn, regurgitation, odynophagia or dysphagia.  No significant diarrhea or constipation.  No melena or hematochezia. No unintentional weight loss. No abdominal pain.   Past Medical History:  Diagnosis Date   Allergy    Migraine with aura    yellowish color seen   Postmenopausal bleeding     Past Surgical History:  Procedure Laterality Date   BREAST BIOPSY Left    benign - years ago   COLONOSCOPY     DILATATION & CURETTAGE/HYSTEROSCOPY WITH MYOSURE N/A 05/06/2016   Procedure: [place];  Surgeon: Salvadore Dom, MD;  Location: [facility];  Service: Gynecology;  Laterality: N/A;   INDUCED ABORTION     LAPAROSCOPY N/A 05/06/2016   Procedure: LAPAROSCOPY OPERATIVE;  Surgeon: Salvadore Dom, MD;  Location: [facility];  Service: Gynecology;  Laterality: N/A;  RNFA NEEDED    SALPINGOOPHORECTOMY Bilateral 05/06/2016   Procedure: Bilateral Salpingo oophorectomy;  Surgeon: Salvadore Dom, MD;  Location: [facility];  Service: Gynecology;  Laterality: Bilateral;   WISDOM TOOTH EXTRACTION Bilateral 10/30/2021    Prior to Admission medications   Medication Sig Start Date End Date Taking? Authorizing Provider  clotrimazole-betamethasone (LOTRISONE) cream Apply 1 application topically 2 (two) times daily. 10/10/20  Yes Cox, Kirsten, MD  triamcinolone (NASACORT) 55 MCG/ACT AERO nasal inhaler Place 2 sprays into the nose daily.   Yes [provider]  loratadine (CLARITIN) 10 MG tablet Take 10 mg by mouth daily.    [provider]  polyethylene glycol powder (GLYCOLAX/MIRALAX) 17 GM/SCOOP powder Take 1 Container by mouth daily.    [provider]    Current Outpatient Medications  Medication Sig  Dispense Refill   clotrimazole-betamethasone (LOTRISONE) cream Apply 1 application topically 2 (two) times daily. 30 g 2   triamcinolone (NASACORT) 55 MCG/ACT AERO nasal inhaler Place 2 sprays into the nose daily.     loratadine (CLARITIN) 10 MG tablet Take 10 mg by mouth daily.     polyethylene glycol powder (GLYCOLAX/MIRALAX) 17 GM/SCOOP powder Take 1 Container by mouth daily.     Current Facility-Administered Medications  Medication Dose Route Frequency Provider Last Rate Last Admin   0.9 %  sodium chloride infusion  500 mL Intravenous Once Jackquline Denmark, MD        Allergies as of 10/12/2022 - Review Complete 10/12/2022  Allergen Reaction Noted   Tequin [gatifloxacin] Other (See Comments) 03/02/2016    Family History  Problem Relation Age of Onset   Hypertension Mother    Osteoporosis Mother    Dementia Mother    Hypertension Father    CVA Father    Ovarian cancer Maternal Aunt    Breast cancer Other    Colon cancer Neg Hx    Colon polyps Neg Hx    Esophageal cancer Neg Hx    Rectal cancer Neg Hx    Stomach cancer Neg Hx     Social History   Socioeconomic History   Marital status: Married    Spouse name: Not on file   Number of children: 0   Years of education: Not on file   Highest education level: Not on file  Occupational History   Occupation: Physician   Tobacco Use  Smoking status: Never   Smokeless tobacco: Never  Vaping Use   Vaping Use: Never used  Substance and Sexual Activity   Alcohol use: Yes    Alcohol/week: 2.0 standard drinks of alcohol    Types: 2 Standard drinks or equivalent per week   Drug use: No   Sexual activity: Not Currently    Partners: Male    Birth control/protection: Post-menopausal  Other Topics Concern   Not on file  Social History Narrative   Not on file   Social Determinants of Health   Financial Resource Strain: Not on file  Food Insecurity: Not on file  Transportation Needs: Not on file  Physical Activity: Not on  file  Stress: Not on file  Social Connections: Not on file  Intimate Partner Violence: Not on file    Review of Systems: Positive for none All other review of systems negative except as mentioned in the HPI.  Physical Exam: Vital signs in last 24 hours: '@VSRANGES'$ @   General:   Alert,  Well-developed, well-nourished, pleasant and cooperative in NAD Lungs:  Clear throughout to auscultation.   Heart:  Regular rate and rhythm; no murmurs, clicks, rubs,  or gallops. Abdomen:  Soft, nontender and nondistended. Normal bowel sounds.   Neuro/Psych:  Alert and cooperative. Normal mood and affect. A and O x 3    No significant changes were identified.  The patient continues to be an appropriate candidate for the planned procedure and anesthesia.   Carmell Austria, MD. [facility] 10/12/2022 8:55 AM@

## 2022-10-12 NOTE — Patient Instructions (Signed)
Discharge instructions given. Handout on Hemorrhoids. Resume previous medications. YOU HAD AN ENDOSCOPIC PROCEDURE TODAY AT [facility]:   Refer to the procedure report that was given to you for any specific questions about what was found during the examination.  If the procedure report does not answer your questions, please call your gastroenterologist to clarify.  If you requested that your care partner not be given the details of your procedure findings, then the procedure report has been included in a sealed envelope for you to review at your convenience later.  YOU SHOULD EXPECT: Some feelings of bloating in the abdomen. Passage of more gas than usual.  Walking can help get rid of the air that was put into your GI tract during the procedure and reduce the bloating. If you had a lower endoscopy (such as a colonoscopy or flexible sigmoidoscopy) you may notice spotting of blood in your stool or on the toilet paper. If you underwent a bowel prep for your procedure, you may not have a normal bowel movement for a few days.  Please Note:  You might notice some irritation and congestion in your nose or some drainage.  This is from the oxygen used during your procedure.  There is no need for concern and it should clear up in a day or so.  SYMPTOMS TO REPORT IMMEDIATELY:  Following lower endoscopy (colonoscopy or flexible sigmoidoscopy):  Excessive amounts of blood in the stool  Significant tenderness or worsening of abdominal pains  Swelling of the abdomen that is new, acute  Fever of 100F or higher   For urgent or emergent issues, a gastroenterologist can be reached at any hour by calling [phone]. Do not use MyChart messaging for urgent concerns.    DIET:  We do recommend a small meal at first, but then you may proceed to your regular diet.  Drink plenty of fluids but you should avoid alcoholic beverages for 24 hours.  ACTIVITY:  You should plan to take it easy for the  rest of today and you should NOT DRIVE or use heavy machinery until tomorrow (because of the sedation medicines used during the test).    FOLLOW UP: Our staff will call the number listed on your records the next business day following your procedure.  We will call around 7:15- 8:00 am to check on you and address any questions or concerns that you may have regarding the information given to you following your procedure. If we do not reach you, we will leave a message.     If any biopsies were taken you will be contacted by phone or by letter within the next 1-3 weeks.  Please call us at [phone] if you have not heard about the biopsies in 3 weeks.    SIGNATURES/CONFIDENTIALITY: You and/or your care partner have signed paperwork which will be entered into your electronic medical record.  These signatures attest to the fact that that the information above on your After Visit Summary has been reviewed and is understood.  Full responsibility of the confidentiality of this discharge information lies with you and/or your care-partner.

## 2022-10-12 NOTE — Progress Notes (Signed)
Report to pacu rn. Vss. Care resumed by rn. 

## 2022-10-12 NOTE — Op Note (Signed)
[place] Patient Name: Michelle Watson Procedure Date: 10/12/2022 8:52 AM MRN: [id number] Endoscopist: Jackquline Denmark , MD, [id number] Age: 61 Referring MD:  Date of Birth: [birth year] Gender: Female Account #: [id number] Procedure:                Colonoscopy Indications:              Screening for colorectal malignant neoplasm Medicines:                Monitored Anesthesia Care Procedure:                Pre-Anesthesia Assessment:                           - Prior to the procedure, a History and Physical                            was performed, and patient medications and                            allergies were reviewed. The patient's tolerance of                            previous anesthesia was also reviewed. The risks                            and benefits of the procedure and the sedation                            options and risks were discussed with the patient.                            All questions were answered, and informed consent                            was obtained. Prior Anticoagulants: The patient has                            taken no anticoagulant or antiplatelet agents. ASA                            Grade Assessment: I - A normal, healthy patient.                            After reviewing the risks and benefits, the patient                            was deemed in satisfactory condition to undergo the                            procedure.                           After obtaining informed consent, the colonoscope  was passed under direct vision. Throughout the                            procedure, the patient's blood pressure, pulse, and                            oxygen saturations were monitored continuously. The                            [id number]-HQ190L Colonoscope was introduced through the                            anus and advanced to the 2 cm into the ileum. The                            colonoscopy was  performed without difficulty. The                            patient tolerated the procedure well. The quality                            of the bowel preparation was good. The terminal                            ileum, ileocecal valve, appendiceal orifice, and                            rectum were photographed. Scope In: 9:01:58 AM Scope Out: 9:14:08 AM Scope Withdrawal Time: 0 hours 6 minutes 43 seconds  Total Procedure Duration: 0 hours 12 minutes 10 seconds  Findings:                 The colon (entire examined portion) appeared normal.                           Non-bleeding internal hemorrhoids were found during                            retroflexion. The hemorrhoids were small and Grade                            I (internal hemorrhoids that do not prolapse).                           The terminal ileum appeared normal.                           The exam was otherwise without abnormality on                            direct and retroflexion views. The colon was                            somewhat redundant. Complications:  No immediate complications. Estimated Blood Loss:     Estimated blood loss: none. Impression:               - Very minimal internal hemorrhoids.                           - The examined portion of the ileum was normal.                           - The examination was otherwise normal on direct                            and retroflexion views.                           - No specimens collected. Recommendation:           - Patient has a contact number available for                            emergencies. The signs and symptoms of potential                            delayed complications were discussed with the                            patient. Return to normal activities tomorrow.                            Written discharge instructions were provided to the                            patient.                           - Resume previous diet.                            - Repeat colonoscopy in 10 years for screening                            purposes. Earlier, if with any new problems or                            change in family history.                           - The findings and recommendations were discussed                            with the patient's family. Jackquline Denmark, MD 10/12/2022 9:18:02 AM This report has been signed electronically.

## 2022-10-12 NOTE — Progress Notes (Signed)
VS completed by DT.  Pt's states no medical or surgical changes since previsit or office visit.  

## 2022-10-13 ENCOUNTER — Telehealth: Payer: Self-pay | Admitting: *Deleted

## 2022-10-13 NOTE — Telephone Encounter (Signed)
  Follow up Call-     10/12/2022    8:34 AM  Call back number  Post procedure Call Back phone  # [phone]  Permission to leave phone message Yes     Patient questions:  Do you have a fever, pain , or abdominal swelling? No. Pain Score  0 *  Have you tolerated food without any problems? Yes.    Have you been able to return to your normal activities? Yes.    Do you have any questions about your discharge instructions: Diet   No. Medications  No. Follow up visit  No.  Do you have questions or concerns about your Care? No.  Actions: * If pain score is 4 or above: No action needed, pain <4.

## 2022-10-14 ENCOUNTER — Other Ambulatory Visit: Payer: BC Managed Care – PPO

## 2022-10-14 DIAGNOSIS — E782 Mixed hyperlipidemia: Secondary | ICD-10-CM | POA: Diagnosis not present

## 2022-10-14 DIAGNOSIS — Z0000 Encounter for general adult medical examination without abnormal findings: Secondary | ICD-10-CM | POA: Diagnosis not present

## 2022-10-15 ENCOUNTER — Encounter: Payer: BC Managed Care – PPO | Admitting: Family Medicine

## 2022-10-15 LAB — CBC WITH DIFFERENTIAL/PLATELET
Basophils Absolute: 0.1 10*3/uL (ref 0.0–0.2)
Basos: 1 %
EOS (ABSOLUTE): 0.1 10*3/uL (ref 0.0–0.4)
Eos: 1 %
Hematocrit: 40.5 % (ref 34.0–46.6)
Hemoglobin: 13.2 g/dL (ref 11.1–15.9)
Immature Grans (Abs): 0 10*3/uL (ref 0.0–0.1)
Immature Granulocytes: 0 %
Lymphocytes Absolute: 1.9 10*3/uL (ref 0.7–3.1)
Lymphs: 36 %
MCH: 31.4 pg (ref 26.6–33.0)
MCHC: 32.6 g/dL (ref 31.5–35.7)
MCV: 96 fL (ref 79–97)
Monocytes Absolute: 0.3 10*3/uL (ref 0.1–0.9)
Monocytes: 6 %
Neutrophils Absolute: 3 10*3/uL (ref 1.4–7.0)
Neutrophils: 56 %
Platelets: 171 10*3/uL (ref 150–450)
RBC: 4.21 x10E6/uL (ref 3.77–5.28)
RDW: 12.5 % (ref 11.7–15.4)
WBC: 5.4 10*3/uL (ref 3.4–10.8)

## 2022-10-15 LAB — COMPREHENSIVE METABOLIC PANEL
ALT: 12 IU/L (ref 0–32)
AST: 19 IU/L (ref 0–40)
Albumin/Globulin Ratio: 2.2 (ref 1.2–2.2)
Albumin: 4.9 g/dL (ref 3.9–4.9)
Alkaline Phosphatase: 71 IU/L (ref 44–121)
BUN/Creatinine Ratio: 33 — ABNORMAL HIGH (ref 12–28)
BUN: 24 mg/dL (ref 8–27)
Bilirubin Total: 0.4 mg/dL (ref 0.0–1.2)
CO2: 24 mmol/L (ref 20–29)
Calcium: 9.2 mg/dL (ref 8.7–10.3)
Chloride: 100 mmol/L (ref 96–106)
Creatinine, Ser: 0.72 mg/dL (ref 0.57–1.00)
Globulin, Total: 2.2 g/dL (ref 1.5–4.5)
Glucose: 84 mg/dL (ref 70–99)
Potassium: 3.8 mmol/L (ref 3.5–5.2)
Sodium: 139 mmol/L (ref 134–144)
Total Protein: 7.1 g/dL (ref 6.0–8.5)
eGFR: 94 mL/min/{1.73_m2} (ref >59)

## 2022-10-15 LAB — TSH: TSH: 2.35 u[IU]/mL (ref 0.450–4.500)

## 2022-10-15 LAB — CARDIOVASCULAR RISK ASSESSMENT

## 2022-10-15 LAB — LIPOPROTEIN A (LPA): Lipoprotein (a): <8.4 nmol/L (ref <75.0)

## 2022-10-15 LAB — LIPID PANEL
Chol/HDL Ratio: 3.4 ratio (ref 0.0–4.4)
Cholesterol, Total: 193 mg/dL (ref 100–199)
HDL: 57 mg/dL (ref >39)
LDL Chol Calc (NIH): 122 mg/dL — ABNORMAL HIGH (ref 0–99)
Triglycerides: 78 mg/dL (ref 0–149)
VLDL Cholesterol Cal: 14 mg/dL (ref 5–40)

## 2022-10-16 ENCOUNTER — Ambulatory Visit (INDEPENDENT_AMBULATORY_CARE_PROVIDER_SITE_OTHER): Payer: BC Managed Care – PPO | Admitting: Family Medicine

## 2022-10-16 ENCOUNTER — Encounter: Payer: Self-pay | Admitting: Family Medicine

## 2022-10-16 VITALS — BP 90/50 | HR 64 | Temp 98.2°F | Resp 14 | Ht 63.0 in | Wt 123.0 lb

## 2022-10-16 DIAGNOSIS — Z0001 Encounter for general adult medical examination with abnormal findings: Secondary | ICD-10-CM | POA: Diagnosis not present

## 2022-10-16 DIAGNOSIS — R42 Dizziness and giddiness: Secondary | ICD-10-CM

## 2022-10-16 DIAGNOSIS — E782 Mixed hyperlipidemia: Secondary | ICD-10-CM

## 2022-10-16 DIAGNOSIS — G4489 Other headache syndrome: Secondary | ICD-10-CM

## 2022-10-16 MED ORDER — TRIAMCINOLONE ACETONIDE 55 MCG/ACT NA AERO: 2.0000 | INHALATION_SPRAY | Freq: Every day | NASAL | 3 refills | Status: AC

## 2022-10-16 MED ORDER — CLOTRIMAZOLE-BETAMETHASONE 1-0.05 % EX CREA: 1.0000 | TOPICAL_CREAM | Freq: Two times a day (BID) | CUTANEOUS | 2 refills | Status: DC

## 2022-10-16 MED ORDER — PRAVASTATIN SODIUM 40 MG PO TABS: 40.0000 mg | ORAL_TABLET | Freq: Every day | ORAL | 3 refills | Status: DC

## 2022-10-16 NOTE — Progress Notes (Signed)
Subjective:  Patient ID: Michelle Watson, female    DOB: 12-Feb-1961  Age: 62 y.o. MRN: [id number]  Chief Complaint  Patient presents with   Annual Exam    HPI Well Adult Physical: Patient here for a comprehensive physical exam.The patient reports no problems Do you take any herbs or supplements that were not prescribed by a doctor? no Are you taking calcium supplements? no Are you taking aspirin daily? no  Encounter for general adult medical examination without abnormal findings  Physical ("At Risk" items are starred): Patient's last physical exam was 1 year ago .  Patient is not afflicted from Stress Incontinence and Urge Incontinence  Patient wears a seat belt, has smoke detectors, has carbon monoxide detectors, practices appropriate gun safety, and wears sunscreen with extended sun exposure. Dental Care: 3 times a year see dentist for cleanings, she brushes and flosses daily. Ophthalmology/Optometry: Annual visit. 07/2022 Hearing loss: none Vision impairments: wear glasses.  Menarche: 12 years Menstrual History: Regular LMP: 10 years Pregnancy history: G1-1-0 Safe at home: yes Self breast exams: monthly  Hyperlipidemia: lipoprotein A came back normal.   [facility] Visit from 10/16/2022 in [place]  PHQ-2 Total Score 0       Current Exercise Habits: The patient does not participate in regular exercise at present Exercise limited by: None identified     Social Hx   Social History   Socioeconomic History   Marital status: Married    Spouse name: Not on file   Number of children: 0   Years of education: Not on file   Highest education level: Not on file  Occupational History   Occupation: Physician   Tobacco Use   Smoking status: Never   Smokeless tobacco: Never  Vaping Use   Vaping Use: Never used  Substance and Sexual Activity   Alcohol use: Yes    Alcohol/week: 2.0 standard drinks of alcohol    Types: 2 Standard drinks or equivalent  per week    Comment: weekly   Drug use: No   Sexual activity: Not Currently    Partners: Male    Birth control/protection: Post-menopausal  Other Topics Concern   Not on file  Social History Narrative   Not on file   Social Determinants of Health   Financial Resource Strain: Not on file  Food Insecurity: No Food Insecurity (10/16/2022)   Hunger Vital Sign    Worried About Running Out of Food in the Last Year: Never true    Ran Out of Food in the Last Year: Never true  Transportation Needs: No Transportation Needs (10/16/2022)   PRAPARE - Hydrologist (Medical): No    Lack of Transportation (Non-Medical): No  Physical Activity: Not on file  Stress: Not on file  Social Connections: Not on file   Past Medical History:  Diagnosis Date   Allergy    Migraine with aura    yellowish color seen   Postmenopausal bleeding    Past Surgical History:  Procedure Laterality Date   BREAST BIOPSY Left    benign - years ago   COLONOSCOPY  10/12/2022   normal   DILATATION & CURETTAGE/HYSTEROSCOPY WITH MYOSURE N/A 05/06/2016   Procedure: DILATATION & CURETTAGE/HYSTEROSCOPY WITH MYOSURE;  Surgeon: Salvadore Dom, MD;  Location: [facility];  Service: Gynecology;  Laterality: N/A;   INDUCED ABORTION     LAPAROSCOPY N/A 05/06/2016   Procedure: LAPAROSCOPY OPERATIVE;  Surgeon: Salvadore Dom, MD;  Location:  [facility];  Service: Gynecology;  Laterality: N/A;  RNFA NEEDED    SALPINGOOPHORECTOMY Bilateral 05/06/2016   Procedure: Bilateral Salpingo oophorectomy;  Surgeon: Salvadore Dom, MD;  Location: [facility];  Service: Gynecology;  Laterality: Bilateral;   WISDOM TOOTH EXTRACTION Bilateral 10/30/2021    Family History  Problem Relation Age of Onset   Hypertension Mother    Osteoporosis Mother    Dementia Mother    Hypertension Father    CVA Father    Ovarian cancer Maternal Aunt    Breast cancer Other    Colon cancer Neg Hx    Colon polyps Neg Hx     Esophageal cancer Neg Hx    Rectal cancer Neg Hx    Stomach cancer Neg Hx     Review of Systems  Constitutional:  Negative for chills, fatigue and fever.  HENT:  Negative for congestion, ear pain and sore throat.   Respiratory:  Negative for cough and shortness of breath.   Cardiovascular:  Negative for chest pain and palpitations.  Gastrointestinal:  Negative for abdominal pain, constipation, diarrhea, nausea and vomiting.  Endocrine: Negative for polydipsia, polyphagia and polyuria.  Genitourinary:  Negative for difficulty urinating and dysuria.  Musculoskeletal:  Negative for arthralgias, back pain and myalgias.  Skin:  Negative for rash.  Neurological:  Positive for dizziness (occasionally) and headaches (ocassionally).  Psychiatric/Behavioral:  Negative for dysphoric mood. The patient is not nervous/anxious.      Objective:  BP (!) 90/50   Pulse 64   Temp 98.2 F (36.8 C)   Resp 14   Ht '5\' 3"'$  (1.6 m)   Wt 123 lb (55.8 kg)   SpO2 98%   BMI 21.79 kg/m      10/16/2022    8:02 AM 10/12/2022    9:39 AM 10/12/2022    9:30 AM  BP/Weight  Systolic BP 90 407 98  Diastolic BP 50 60 49  Wt. (Lbs) 123    BMI 21.79 kg/m2      Physical Exam  Lab Results  Component Value Date   WBC 5.4 10/14/2022   HGB 13.2 10/14/2022   HCT 40.5 10/14/2022   PLT 171 10/14/2022   GLUCOSE 84 10/14/2022   CHOL 193 10/14/2022   TRIG 78 10/14/2022   HDL 57 10/14/2022   LDLCALC 122 (H) 10/14/2022   ALT 12 10/14/2022   AST 19 10/14/2022   NA 139 10/14/2022   K 3.8 10/14/2022   CL 100 10/14/2022   CREATININE 0.72 10/14/2022   BUN 24 10/14/2022   CO2 24 10/14/2022   TSH 2.350 10/14/2022      Assessment & Plan:   Problem List Items Addressed This Visit       Other   Encounter for general adult medical examination with abnormal findings - Primary    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  Recommend continue to work on eating healthy diet and exercise.        Vertigo    Order mri of brain      Relevant Orders   MR BRAIN IAC W CONTRAST   Other headache syndrome    New and worsening headaches associated with vertigo.  Order an mri of her brain.       Relevant Orders   MR BRAIN IAC W CONTRAST   Mixed hyperlipidemia    Start on pravastatin 409  mg before bed.  Recommend continue to work on eating healthy diet and exercise. Recheck lipids in 3 months.  Relevant Medications   pravastatin (PRAVACHOL) 40 MG tablet   Other Relevant Orders   Comprehensive metabolic panel   Lipid panel      Body mass index is 21.79 kg/m.   This is a list of the screening recommended for you and due dates:  Health Maintenance  Topic Date Due   HIV Screening  Never done   Hepatitis C Screening: USPSTF Recommendation to screen - Ages 44-79 yo.  Never done   COVID-19 Vaccine (5 - 2023-24 season) 06/26/2022   Pap Smear  07/05/2022   Mammogram  08/19/2024   DTaP/Tdap/Td vaccine (3 - Td or Tdap) 10/14/2031   Colon Cancer Screening  10/12/2032   Flu Shot  Completed   Zoster (Shingles) Vaccine  Completed   HPV Vaccine  Aged Out     Meds ordered this encounter  Medications   triamcinolone (NASACORT) 55 MCG/ACT AERO nasal inhaler    Sig: Place 2 sprays into the nose daily.    Dispense:  3 each    Refill:  3   pravastatin (PRAVACHOL) 40 MG tablet    Sig: Take 1 tablet (40 mg total) by mouth daily.    Dispense:  90 tablet    Refill:  3   clotrimazole-betamethasone (LOTRISONE) cream    Sig: Apply 1 Application topically 2 (two) times daily.    Dispense:  45 g    Refill:  2    Follow-up: Return in about 3 months (around 01/15/2023) for lab visit.  An After Visit Summary was printed and given to the patient.  Rochel Brome, MD [facility] [phone]

## 2022-10-21 ENCOUNTER — Encounter: Payer: Self-pay | Admitting: Family Medicine

## 2022-10-25 DIAGNOSIS — R42 Dizziness and giddiness: Secondary | ICD-10-CM | POA: Insufficient documentation

## 2022-10-25 DIAGNOSIS — E782 Mixed hyperlipidemia: Secondary | ICD-10-CM | POA: Insufficient documentation

## 2022-10-25 DIAGNOSIS — G4489 Other headache syndrome: Secondary | ICD-10-CM | POA: Insufficient documentation

## 2022-10-25 DIAGNOSIS — Z0001 Encounter for general adult medical examination with abnormal findings: Secondary | ICD-10-CM | POA: Insufficient documentation

## 2022-10-25 NOTE — Assessment & Plan Note (Signed)
New and worsening headaches associated with vertigo.  Order an mri of her brain.

## 2022-10-25 NOTE — Assessment & Plan Note (Signed)
Start on pravastatin 409  mg before bed.  Recommend continue to work on eating healthy diet and exercise. Recheck lipids in 3 months.

## 2022-10-25 NOTE — Assessment & Plan Note (Signed)
Order mri of brain

## 2022-10-25 NOTE — Assessment & Plan Note (Signed)
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  Recommend continue to work on eating healthy diet and exercise.

## 2022-12-06 DIAGNOSIS — R42 Dizziness and giddiness: Secondary | ICD-10-CM | POA: Diagnosis not present

## 2022-12-06 DIAGNOSIS — G4489 Other headache syndrome: Secondary | ICD-10-CM | POA: Diagnosis not present

## 2022-12-10 ENCOUNTER — Encounter: Payer: Self-pay | Admitting: Family Medicine

## 2023-07-09 ENCOUNTER — Other Ambulatory Visit: Payer: Self-pay | Admitting: Family Medicine

## 2023-07-09 DIAGNOSIS — Z1231 Encounter for screening mammogram for malignant neoplasm of breast: Secondary | ICD-10-CM

## 2023-08-05 DIAGNOSIS — Z23 Encounter for immunization: Secondary | ICD-10-CM | POA: Diagnosis not present

## 2023-08-18 DIAGNOSIS — H43813 Vitreous degeneration, bilateral: Secondary | ICD-10-CM | POA: Diagnosis not present

## 2023-08-18 DIAGNOSIS — H5213 Myopia, bilateral: Secondary | ICD-10-CM | POA: Diagnosis not present

## 2023-08-18 DIAGNOSIS — H2513 Age-related nuclear cataract, bilateral: Secondary | ICD-10-CM | POA: Diagnosis not present

## 2023-08-18 DIAGNOSIS — H524 Presbyopia: Secondary | ICD-10-CM | POA: Diagnosis not present

## 2023-08-23 ENCOUNTER — Ambulatory Visit
Admission: RE | Admit: 2023-08-23 | Discharge: 2023-08-23 | Disposition: A | Discharge status: Home / Self Care | Payer: BC Managed Care – PPO | Source: Ambulatory Visit | Attending: Family Medicine | Admitting: Family Medicine

## 2023-08-23 DIAGNOSIS — Z1231 Encounter for screening mammogram for malignant neoplasm of breast: Secondary | ICD-10-CM

## 2023-09-09 DIAGNOSIS — Z23 Encounter for immunization: Secondary | ICD-10-CM | POA: Diagnosis not present

## 2023-10-21 ENCOUNTER — Ambulatory Visit: Payer: BC Managed Care – PPO | Admitting: Family Medicine

## 2023-10-21 ENCOUNTER — Encounter: Payer: Self-pay | Admitting: Family Medicine

## 2023-10-21 VITALS — BP 110/60 | HR 67 | Temp 97.4°F | Resp 12 | Ht 63.0 in | Wt 128.0 lb

## 2023-10-21 DIAGNOSIS — G43E19 Chronic migraine with aura, intractable, without status migrainosus: Secondary | ICD-10-CM | POA: Diagnosis not present

## 2023-10-21 DIAGNOSIS — H8110 Benign paroxysmal vertigo, unspecified ear: Secondary | ICD-10-CM | POA: Diagnosis not present

## 2023-10-21 DIAGNOSIS — Z0001 Encounter for general adult medical examination with abnormal findings: Secondary | ICD-10-CM

## 2023-10-21 DIAGNOSIS — E782 Mixed hyperlipidemia: Secondary | ICD-10-CM

## 2023-10-21 DIAGNOSIS — Z8262 Family history of osteoporosis: Secondary | ICD-10-CM

## 2023-10-21 MED ORDER — PROPRANOLOL HCL 20 MG PO TABS: 20.0000 mg | ORAL_TABLET | Freq: Two times a day (BID) | ORAL | 2 refills | Status: DC

## 2023-10-21 NOTE — Patient Instructions (Signed)
Start on propranolol 20 mg one oral twice daily (may start with once daily.) Message me with which triptan you would like.

## 2023-10-21 NOTE — Progress Notes (Signed)
Subjective:  Patient ID: Michelle Watson, female    DOB: Jan 31, 1961  Age: 62 y.o. MRN: [id number]  Chief Complaint  Patient presents with   Annual Exam    HPI Well Adult Physical: Patient here for a comprehensive physical exam.The patient reports no problems Do you take any herbs or supplements that were not prescribed by a doctor? no Are you taking calcium supplements? no Are you taking aspirin daily? no  Encounter for general adult medical examination without abnormal findings  Physical ("At Risk" items are starred): Patient's last physical exam was 1 year ago .  Patient is not afflicted from Stress Incontinence and Urge Incontinence  Patient wears a seat belts Patient has smoke detectors and has carbon monoxide detectors. Patient wears sunscreen with extended sun exposure. Dental Care: biannual cleanings, brushes and flosses daily. Ophthalmology/Optometry: Annual visit.  Hearing loss: none Vision impairments: eyeglass  Menarche: 62 years old Menstrual History: regular LMP: postmenoupasal Pregnancy history: G1P000 Safe at home: yes Self breast exams: monthly     10/21/2023    3:25 PM 10/16/2022    8:11 AM 10/13/2021   11:57 AM 10/10/2020    2:08 PM  Depression screen PHQ 2/9  Decreased Interest 0 0 0 0  Down, Depressed, Hopeless 1 0 0 0  PHQ - 2 Score 1 0 0 0  Altered sleeping 1     Tired, decreased energy 1     Change in appetite 0     Feeling bad or failure about yourself  0     Trouble concentrating 0     Moving slowly or fidgety/restless 0     Suicidal thoughts 0     PHQ-9 Score 3     Difficult doing work/chores Not difficult at all            03/15/2021   10:05 PM 10/16/2022    8:10 AM 10/21/2023    3:25 PM  Fall Risk  Falls in the past year?  0 0  Was there an injury with Fall?  0 0  Fall Risk Category Calculator  0 0  Fall Risk Category (Retired)  Low   (RETIRED) Patient Fall Risk Level Low fall risk Low fall risk   Patient at Risk for Falls  Due to  No Fall Risks No Fall Risks  Fall risk Follow up  Education provided              Social Hx   Social History   Socioeconomic History   Marital status: Married    Spouse name: Not on file   Number of children: 0   Years of education: Not on file   Highest education level: Professional school degree (e.g., MD, DDS, DVM, JD)  Occupational History   Occupation: Physician   Tobacco Use   Smoking status: Never   Smokeless tobacco: Never  Vaping Use   Vaping status: Never Used  Substance and Sexual Activity   Alcohol use: Yes    Alcohol/week: 2.0 standard drinks of alcohol    Types: 2 Standard drinks or equivalent per week    Comment: 1-2 drinks of beer wine or liquor a week   Drug use: No   Sexual activity: Not Currently    Partners: Male    Birth control/protection: Post-menopausal  Other Topics Concern   Not on file  Social History Narrative   Not on file   Social Drivers of Health   Financial Resource Strain: Low Risk  (10/17/2023)   Overall  Financial Resource Strain (CARDIA)    Difficulty of Paying Living Expenses: Not hard at all  Food Insecurity: No Food Insecurity (10/17/2023)   Hunger Vital Sign    Worried About Running Out of Food in the Last Year: Never true    Ran Out of Food in the Last Year: Never true  Transportation Needs: No Transportation Needs (10/17/2023)   PRAPARE - Administrator, Civil Service (Medical): No    Lack of Transportation (Non-Medical): No  Physical Activity: Insufficiently Active (10/17/2023)   Exercise Vital Sign    Days of Exercise per Week: 3 days    Minutes of Exercise per Session: 20 min  Stress: Stress Concern Present (10/17/2023)   [employer] of Occupational Health - Occupational Stress Questionnaire    Feeling of Stress : To some extent  Social Connections: Socially Isolated (10/17/2023)   Social Connection and Isolation Panel [NHANES]    Frequency of Communication with Friends and Family: Never     Frequency of Social Gatherings with Friends and Family: Never    Attends Religious Services: Never    Database administrator or Organizations: No    Attends Engineer, structural: Not on file    Marital Status: Married   Past Medical History:  Diagnosis Date   Allergy    Depression    Hyperlipidemia 2023   Migraine with aura    yellowish color seen   Postmenopausal bleeding    Past Surgical History:  Procedure Laterality Date   BREAST BIOPSY Left    benign - years ago   COLONOSCOPY  10/12/2022   normal   DILATATION & CURETTAGE/HYSTEROSCOPY WITH MYOSURE N/A 05/06/2016   Procedure: DILATATION & CURETTAGE/HYSTEROSCOPY WITH MYOSURE;  Surgeon: Romualdo Bolk, MD;  Location: [facility];  Service: Gynecology;  Laterality: N/A;   INDUCED ABORTION     LAPAROSCOPY N/A 05/06/2016   Procedure: LAPAROSCOPY OPERATIVE;  Surgeon: Romualdo Bolk, MD;  Location: [facility];  Service: Gynecology;  Laterality: N/A;  RNFA NEEDED    SALPINGOOPHORECTOMY Bilateral 05/06/2016   Procedure: Bilateral Salpingo oophorectomy;  Surgeon: Romualdo Bolk, MD;  Location: [facility];  Service: Gynecology;  Laterality: Bilateral;   WISDOM TOOTH EXTRACTION Bilateral 10/30/2021    Family History  Problem Relation Age of Onset   Hypertension Mother    Osteoporosis Mother    Dementia Mother    Hypertension Father    CVA Father    Alcohol abuse Father    COPD Father    Stroke Father    Ovarian cancer Maternal Aunt    Breast cancer Other    Colon cancer Neg Hx    Colon polyps Neg Hx    Esophageal cancer Neg Hx    Rectal cancer Neg Hx    Stomach cancer Neg Hx     Review of Systems  Constitutional:  Negative for chills, fatigue and fever.  HENT:  Negative for congestion, ear pain and sore throat.   Respiratory:  Negative for cough and shortness of breath.   Cardiovascular:  Negative for chest pain and palpitations.  Gastrointestinal:  Negative for abdominal pain, constipation, diarrhea,  nausea and vomiting.  Endocrine: Negative for polydipsia, polyphagia and polyuria.  Genitourinary:  Negative for difficulty urinating and dysuria.  Musculoskeletal:  Negative for arthralgias, back pain and myalgias.  Skin:  Negative for rash.  Neurological:  Positive for dizziness (intermittent BPPV.) and headaches (migraines worsened in October. maxalt, relpax, frova helped. imitrex did not help as much. Usually  rt sided if migraines. Also gets headaches in back of neck and also will get on top of her head.).  Psychiatric/Behavioral:  Negative for dysphoric mood. The patient is not nervous/anxious.    Ubrelvy did not help when given samples. Naproxen usually helps. Some nausea. Previously tried an SSRI.   Objective:  BP 110/60 (Cuff Size: Normal)   Pulse 67   Temp (!) 97.4 F (36.3 C)   Resp 12   Ht 5\' 3"  (1.6 m)   Wt 128 lb (58.1 kg)   SpO2 97%   BMI 22.67 kg/m      10/21/2023    3:21 PM 10/16/2022    8:02 AM 10/12/2022    9:39 AM  BP/Weight  Systolic BP 110 90 116  Diastolic BP 60 50 60  Wt. (Lbs) 128 123   BMI 22.67 kg/m2 21.79 kg/m2     Physical Exam Vitals reviewed.  Constitutional:      General: She is not in acute distress.    Appearance: Normal appearance. She is normal weight.  HENT:     Right Ear: Tympanic membrane and ear canal normal.     Left Ear: Tympanic membrane and ear canal normal.     Nose: Nose normal. No congestion or rhinorrhea.  Eyes:     Conjunctiva/sclera: Conjunctivae normal.  Neck:     Thyroid: No thyroid mass.     Vascular: No carotid bruit.  Cardiovascular:     Rate and Rhythm: Normal rate and regular rhythm.     Pulses: Normal pulses.     Heart sounds: Normal heart sounds. No murmur heard. Pulmonary:     Effort: Pulmonary effort is normal.     Breath sounds: Normal breath sounds.  Abdominal:     General: Bowel sounds are normal.     Palpations: Abdomen is soft. There is no mass.     Tenderness: There is no abdominal tenderness.   Musculoskeletal:        General: Normal range of motion.  Lymphadenopathy:     Cervical: No cervical adenopathy.  Skin:    General: Skin is warm and dry.  Neurological:     Mental Status: She is alert and oriented to person, place, and time.     Cranial Nerves: No cranial nerve deficit.  Psychiatric:        Mood and Affect: Mood normal.        Behavior: Behavior normal.     Lab Results  Component Value Date   WBC 8.1 10/21/2023   HGB 13.9 10/21/2023   HCT 42.5 10/21/2023   PLT 211 10/21/2023   GLUCOSE 99 10/21/2023   CHOL 220 (H) 10/21/2023   TRIG 62 10/21/2023   HDL 66 10/21/2023   LDLCALC 143 (H) 10/21/2023   ALT 13 10/21/2023   AST 21 10/21/2023   NA 141 10/21/2023   K 4.3 10/21/2023   CL 100 10/21/2023   CREATININE 0.64 10/21/2023   BUN 29 (H) 10/21/2023   CO2 26 10/21/2023   TSH 1.370 10/21/2023      Assessment & Plan:  Encounter for general adult medical examination with abnormal findings Assessment & Plan: Recommendations discussed   Orders: -     CBC with Differential/Platelet -     Comprehensive metabolic panel -     Lipid panel -     TSH  Intractable chronic migraine with aura and without status migrainosus Assessment & Plan: Start on propranolol 20 mg daily. May increase to twice daily if  not working and tolerating well.   Orders: -     Propranolol HCl; Take 1 tablet (20 mg total) by mouth 2 (two) times daily.  Dispense: 60 tablet; Refill: 2  Family history of osteoporosis Assessment & Plan: Check vitamin D  Orders: -     VITAMIN D 25 Hydroxy (Vit-D Deficiency, Fractures)  Benign paroxysmal positional vertigo, unspecified laterality Assessment & Plan: Intermittent. Meclizine if needed.   Mixed hyperlipidemia Assessment & Plan: Elevated.  Recommend start pravastatin 40 mg daily.  Recheck in 3 months.  Recommend continue to work on eating healthy diet and exercise. Consider calcium score.      Body mass index is 22.67 kg/m.    These are the goals we discussed:  Goals   None      This is a list of the screening recommended for you and due dates:  Health Maintenance  Topic Date Due   HIV Screening  Never done   Hepatitis C Screening  Never done   COVID-19 Vaccine (5 - 2024-25 season) 06/27/2023   Pap with HPV screening  07/05/2024   Mammogram  08/22/2025   DTaP/Tdap/Td vaccine (3 - Td or Tdap) 10/14/2031   Colon Cancer Screening  10/12/2032   Flu Shot  Completed   Zoster (Shingles) Vaccine  Completed   HPV Vaccine  Aged Out     Meds ordered this encounter  Medications   propranolol (INDERAL) 20 MG tablet    Sig: Take 1 tablet (20 mg total) by mouth 2 (two) times daily.    Dispense:  60 tablet    Refill:  2    Follow-up: Return in about 1 year (around 10/20/2024) for cpe, lab visit in 3 months for FLP and LFTs if she starts pravastatin. . 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   An After Visit Summary was printed and given to the patient.  Blane Ohara, MD [facility] [phone]

## 2023-10-22 ENCOUNTER — Encounter: Payer: Self-pay | Admitting: Family Medicine

## 2023-10-22 LAB — CBC WITH DIFFERENTIAL/PLATELET
Basophils Absolute: 0.1 10*3/uL (ref 0.0–0.2)
Basos: 1 %
EOS (ABSOLUTE): 0 10*3/uL (ref 0.0–0.4)
Eos: 0 %
Hematocrit: 42.5 % (ref 34.0–46.6)
Hemoglobin: 13.9 g/dL (ref 11.1–15.9)
Immature Grans (Abs): 0 10*3/uL (ref 0.0–0.1)
Immature Granulocytes: 0 %
Lymphocytes Absolute: 2.2 10*3/uL (ref 0.7–3.1)
Lymphs: 27 %
MCH: 31.3 pg (ref 26.6–33.0)
MCHC: 32.7 g/dL (ref 31.5–35.7)
MCV: 96 fL (ref 79–97)
Monocytes Absolute: 0.4 10*3/uL (ref 0.1–0.9)
Monocytes: 5 %
Neutrophils Absolute: 5.4 10*3/uL (ref 1.4–7.0)
Neutrophils: 67 %
Platelets: 211 10*3/uL (ref 150–450)
RBC: 4.44 x10E6/uL (ref 3.77–5.28)
RDW: 12.1 % (ref 11.7–15.4)
WBC: 8.1 10*3/uL (ref 3.4–10.8)

## 2023-10-22 LAB — COMPREHENSIVE METABOLIC PANEL
ALT: 13 [IU]/L (ref 0–32)
AST: 21 [IU]/L (ref 0–40)
Albumin: 5 g/dL — ABNORMAL HIGH (ref 3.9–4.9)
Alkaline Phosphatase: 80 [IU]/L (ref 44–121)
BUN/Creatinine Ratio: 45 — ABNORMAL HIGH (ref 12–28)
BUN: 29 mg/dL — ABNORMAL HIGH (ref 8–27)
Bilirubin Total: 0.3 mg/dL (ref 0.0–1.2)
CO2: 26 mmol/L (ref 20–29)
Calcium: 9.7 mg/dL (ref 8.7–10.3)
Chloride: 100 mmol/L (ref 96–106)
Creatinine, Ser: 0.64 mg/dL (ref 0.57–1.00)
Globulin, Total: 2.3 g/dL (ref 1.5–4.5)
Glucose: 99 mg/dL (ref 70–99)
Potassium: 4.3 mmol/L (ref 3.5–5.2)
Sodium: 141 mmol/L (ref 134–144)
Total Protein: 7.3 g/dL (ref 6.0–8.5)
eGFR: 99 mL/min/{1.73_m2} (ref >59)

## 2023-10-22 LAB — LIPID PANEL
Chol/HDL Ratio: 3.3 {ratio} (ref 0.0–4.4)
Cholesterol, Total: 220 mg/dL — ABNORMAL HIGH (ref 100–199)
HDL: 66 mg/dL (ref >39)
LDL Chol Calc (NIH): 143 mg/dL — ABNORMAL HIGH (ref 0–99)
Triglycerides: 62 mg/dL (ref 0–149)
VLDL Cholesterol Cal: 11 mg/dL (ref 5–40)

## 2023-10-22 LAB — TSH: TSH: 1.37 u[IU]/mL (ref 0.450–4.500)

## 2023-10-22 LAB — VITAMIN D 25 HYDROXY (VIT D DEFICIENCY, FRACTURES): Vit D, 25-Hydroxy: 36.5 ng/mL (ref 30.0–100.0)

## 2023-10-22 NOTE — Assessment & Plan Note (Signed)
Recommendations discussed

## 2023-10-22 NOTE — Assessment & Plan Note (Signed)
Intermittent. Meclizine if needed.

## 2023-10-22 NOTE — Assessment & Plan Note (Signed)
Check vitamin D. 

## 2023-10-22 NOTE — Assessment & Plan Note (Signed)
Elevated.  Recommend start pravastatin 40 mg daily.  Recheck in 3 months.  Recommend continue to work on eating healthy diet and exercise. Consider calcium score.

## 2023-10-22 NOTE — Assessment & Plan Note (Signed)
Start on propranolol 20 mg daily. May increase to twice daily if not working and tolerating well.

## 2023-10-25 ENCOUNTER — Other Ambulatory Visit: Payer: Self-pay

## 2023-10-25 MED ORDER — PRAVASTATIN SODIUM 40 MG PO TABS: 40.0000 mg | ORAL_TABLET | Freq: Every day | ORAL | 0 refills | Status: DC

## 2024-01-30 ENCOUNTER — Other Ambulatory Visit: Payer: Self-pay | Admitting: Family Medicine

## 2024-01-30 DIAGNOSIS — G43E19 Chronic migraine with aura, intractable, without status migrainosus: Secondary | ICD-10-CM

## 2024-03-17 ENCOUNTER — Other Ambulatory Visit

## 2024-03-17 DIAGNOSIS — E782 Mixed hyperlipidemia: Secondary | ICD-10-CM

## 2024-03-18 LAB — COMPREHENSIVE METABOLIC PANEL WITH GFR
ALT: 16 IU/L (ref 0–32)
AST: 22 IU/L (ref 0–40)
Albumin: 4.6 g/dL (ref 3.9–4.9)
Alkaline Phosphatase: 67 IU/L (ref 44–121)
BUN/Creatinine Ratio: 38 — ABNORMAL HIGH (ref 12–28)
BUN: 27 mg/dL (ref 8–27)
Bilirubin Total: 0.3 mg/dL (ref 0.0–1.2)
CO2: 22 mmol/L (ref 20–29)
Calcium: 9.3 mg/dL (ref 8.7–10.3)
Chloride: 103 mmol/L (ref 96–106)
Creatinine, Ser: 0.72 mg/dL (ref 0.57–1.00)
Globulin, Total: 2.2 g/dL (ref 1.5–4.5)
Glucose: 85 mg/dL (ref 70–99)
Potassium: 4 mmol/L (ref 3.5–5.2)
Sodium: 142 mmol/L (ref 134–144)
Total Protein: 6.8 g/dL (ref 6.0–8.5)
eGFR: 94 mL/min/{1.73_m2} (ref >59)

## 2024-03-18 LAB — LIPID PANEL
Chol/HDL Ratio: 2.7 ratio (ref 0.0–4.4)
Cholesterol, Total: 146 mg/dL (ref 100–199)
HDL: 55 mg/dL (ref >39)
LDL Chol Calc (NIH): 74 mg/dL (ref 0–99)
Triglycerides: 89 mg/dL (ref 0–149)
VLDL Cholesterol Cal: 17 mg/dL (ref 5–40)

## 2024-03-20 ENCOUNTER — Ambulatory Visit: Payer: Self-pay | Admitting: Family Medicine

## 2024-03-20 ENCOUNTER — Encounter: Payer: Self-pay | Admitting: Family Medicine

## 2024-03-20 MED ORDER — PRAVASTATIN SODIUM 40 MG PO TABS: 40.0000 mg | ORAL_TABLET | Freq: Every day | ORAL | 3 refills | Status: AC

## 2024-04-22 ENCOUNTER — Other Ambulatory Visit: Payer: Self-pay

## 2024-04-22 DIAGNOSIS — G43E19 Chronic migraine with aura, intractable, without status migrainosus: Secondary | ICD-10-CM

## 2024-07-13 ENCOUNTER — Other Ambulatory Visit: Payer: Self-pay | Admitting: Family Medicine

## 2024-07-13 DIAGNOSIS — Z1231 Encounter for screening mammogram for malignant neoplasm of breast: Secondary | ICD-10-CM

## 2024-08-18 ENCOUNTER — Ambulatory Visit (INDEPENDENT_AMBULATORY_CARE_PROVIDER_SITE_OTHER)

## 2024-08-18 DIAGNOSIS — Z23 Encounter for immunization: Secondary | ICD-10-CM

## 2024-08-24 ENCOUNTER — Ambulatory Visit: Admission: RE | Admit: 2024-08-24 | Discharge: 2024-08-24 | Disposition: A | Source: Ambulatory Visit

## 2024-08-24 DIAGNOSIS — H5213 Myopia, bilateral: Secondary | ICD-10-CM | POA: Diagnosis not present

## 2024-08-24 DIAGNOSIS — Z1231 Encounter for screening mammogram for malignant neoplasm of breast: Secondary | ICD-10-CM

## 2024-08-24 DIAGNOSIS — H2513 Age-related nuclear cataract, bilateral: Secondary | ICD-10-CM | POA: Diagnosis not present

## 2024-08-24 DIAGNOSIS — H43813 Vitreous degeneration, bilateral: Secondary | ICD-10-CM | POA: Diagnosis not present

## 2024-08-24 DIAGNOSIS — H524 Presbyopia: Secondary | ICD-10-CM | POA: Diagnosis not present

## 2024-08-28 ENCOUNTER — Ambulatory Visit: Payer: Self-pay | Admitting: Family Medicine

## 2024-08-29 ENCOUNTER — Other Ambulatory Visit: Payer: Self-pay | Admitting: Family Medicine

## 2024-08-29 DIAGNOSIS — R928 Other abnormal and inconclusive findings on diagnostic imaging of breast: Secondary | ICD-10-CM

## 2024-09-11 ENCOUNTER — Other Ambulatory Visit: Payer: Self-pay | Admitting: Family Medicine

## 2024-09-11 ENCOUNTER — Ambulatory Visit
Admission: RE | Admit: 2024-09-11 | Discharge: 2024-09-11 | Disposition: A | Discharge status: Home / Self Care | Source: Ambulatory Visit | Attending: Family Medicine | Admitting: Family Medicine

## 2024-09-11 DIAGNOSIS — N6489 Other specified disorders of breast: Secondary | ICD-10-CM | POA: Diagnosis not present

## 2024-09-11 DIAGNOSIS — R928 Other abnormal and inconclusive findings on diagnostic imaging of breast: Secondary | ICD-10-CM

## 2024-09-12 ENCOUNTER — Ambulatory Visit: Payer: Self-pay | Admitting: Family Medicine

## 2024-09-13 ENCOUNTER — Encounter: Payer: Self-pay | Admitting: Family Medicine

## 2024-09-14 ENCOUNTER — Ambulatory Visit (INDEPENDENT_AMBULATORY_CARE_PROVIDER_SITE_OTHER)

## 2024-09-14 DIAGNOSIS — Z23 Encounter for immunization: Secondary | ICD-10-CM

## 2024-09-14 NOTE — Progress Notes (Signed)
 Patient is in office today for a nurse visit for Immunization. Patient Injection was given in the  Right deltoid. Patient tolerated injection well.

## 2024-09-15 ENCOUNTER — Telehealth: Payer: Self-pay

## 2024-09-15 NOTE — Telephone Encounter (Signed)
 Copied from CRM [id number]. Topic: MyChart - Other >> Sep 15, 2024  2:52 PM Selinda RAMAN wrote: Reason for CRM: The patient left a message on her my chart several days ago for her provider and it still hasn't been addressed. She is really needing this medication for migraine purposes. She has requested generic Maxalt  on a 30 day supply to be called into her pharmacy at  [facility] - [place], [place] - [address]  Phone: [phone] Fax: [phone]   Please assist patient further

## 2024-09-17 ENCOUNTER — Other Ambulatory Visit: Payer: Self-pay | Admitting: Family Medicine

## 2024-09-17 ENCOUNTER — Encounter: Payer: Self-pay | Admitting: Family Medicine

## 2024-09-17 MED ORDER — RIZATRIPTAN BENZOATE 10 MG PO TABS: 10.0000 mg | ORAL_TABLET | ORAL | 2 refills | Status: AC | PRN

## 2024-09-17 NOTE — Telephone Encounter (Signed)
 1

## 2024-09-17 NOTE — Telephone Encounter (Signed)
 Done

## 2024-10-27 ENCOUNTER — Ambulatory Visit: Payer: BC Managed Care – PPO | Admitting: Family Medicine

## 2024-10-27 VITALS — BP 122/64 | HR 74 | Temp 98.0°F | Ht 63.0 in | Wt 132.5 lb

## 2024-10-27 DIAGNOSIS — R400 Somnolence: Secondary | ICD-10-CM | POA: Diagnosis not present

## 2024-10-27 DIAGNOSIS — Z23 Encounter for immunization: Secondary | ICD-10-CM | POA: Diagnosis not present

## 2024-10-27 DIAGNOSIS — Z0001 Encounter for general adult medical examination with abnormal findings: Secondary | ICD-10-CM

## 2024-10-27 DIAGNOSIS — E782 Mixed hyperlipidemia: Secondary | ICD-10-CM | POA: Diagnosis not present

## 2024-10-27 LAB — POCT LIPID PANEL
HDL: 63
LDL: 148
Non-HDL: 166
TC: 229
TRG: 90

## 2024-10-27 MED ORDER — CLOTRIMAZOLE-BETAMETHASONE 1-0.05 % EX CREA: 1.0000 | TOPICAL_CREAM | Freq: Two times a day (BID) | CUTANEOUS | 2 refills | Status: AC

## 2024-10-27 NOTE — Progress Notes (Signed)
 "  Subjective:  Patient ID: Michelle Watson, female    DOB: April 03, 1961  Age: 64 y.o. MRN: [id number]  Chief Complaint  Patient presents with   Annual Exam    Well Adult Physical: Patient here for a comprehensive physical exam.The patient reports problems - VERTIGO OCCASIONALLY. COMES ON WITH MOTION. STOPPED PROPRANOLOL  DUE TO DYSPNEA ON EXERTION. HELD AND IT GOT BETTER. HELD CHOLESTEROL MEDICINE, ACHINESS IN LEGS. STATIN  HELD AND RESOLVED. HEADACHES WORSENED FOR ONE WEEK AFTER STOPPING PROPRANOLOL .  HELD LORATADINE AS IT WAS NOT HELPING FOR ETD. USING NASAL SALINE RINSE. USING AIR PURIFIER. HELPED.  Do you take any herbs or supplements that were not prescribed by a doctor? no Are you taking calcium supplements? no Are you taking aspirin daily? no  Encounter for general adult medical examination without abnormal findings  Physical (At Risk items are starred): Patient's last physical exam was 1 year ago .  Patient is not afflicted from Stress Incontinence and Urge Incontinence  Patient wears a seat belts Patient has smoke detectors and has carbon monoxide detectors. Patient wears sunscreen with extended sun exposure. Dental Care: biannual cleanings, brushes and flosses daily. Ophthalmology/Optometry: Annual visit.  Hearing loss: none Vision impairments: glasses  Menarche: 64 years old Menstrual History: regular LMP: postmenoupasal Pregnancy history: G1P000 Safe at home: yes Self breast exams: monthly     10/27/2024    7:46 AM 10/21/2023    3:25 PM 10/16/2022    8:11 AM 10/13/2021   11:57 AM 10/10/2020    2:08 PM  Depression screen PHQ 2/9  Decreased Interest 1 0 0 0 0  Down, Depressed, Hopeless 2 1 0 0 0  PHQ - 2 Score 3 1 0 0 0  Altered sleeping 2 1     Tired, decreased energy 2 1     Change in appetite 0 0     Feeling bad or failure about yourself  1 0     Trouble concentrating 0 0     Moving slowly or fidgety/restless 0 0     Suicidal thoughts 0 0     PHQ-9 Score 8  3      Difficult doing work/chores Somewhat difficult Not difficult at all        Data saved with a previous flowsheet row definition         03/15/2021   10:05 PM 10/16/2022    8:10 AM 10/21/2023    3:25 PM  Fall Risk  Falls in the past year?  0 0  Was there an injury with Fall?  0  0   Fall Risk Category Calculator  0 0  Fall Risk Category (Retired)  Low    (RETIRED) Patient Fall Risk Level Low fall risk  Low fall risk    Patient at Risk for Falls Due to  No Fall Risks No Fall Risks  Fall risk Follow up  Education provided       Data saved with a previous flowsheet row definition      Social Hx   Social History   Socioeconomic History   Marital status: Married    Spouse name: Not on file   Number of children: 0   Years of education: Not on file   Highest education level: Professional school degree (e.g., MD, DDS, DVM, JD)  Occupational History   Occupation: Physician   Tobacco Use   Smoking status: Never   Smokeless tobacco: Never  Vaping Use   Vaping status: Never Used  Substance  and Sexual Activity   Alcohol use: Yes    Alcohol/week: 2.0 standard drinks of alcohol    Types: 2 Standard drinks or equivalent per week    Comment: 1-2 drinks of beer wine or liquor a week   Drug use: No   Sexual activity: Not Currently    Partners: Male    Birth control/protection: Post-menopausal  Other Topics Concern   Not on file  Social History Narrative   Not on file   Social Drivers of Health   Tobacco Use: Low Risk (03/20/2024)   Patient History    Smoking Tobacco Use: Never    Smokeless Tobacco Use: Never    Passive Exposure: Not on file  Financial Resource Strain: Low Risk (10/23/2024)   Overall Financial Resource Strain (CARDIA)    Difficulty of Paying Living Expenses: Not hard at all  Food Insecurity: No Food Insecurity (10/23/2024)   Epic    Worried About Radiation Protection Practitioner of Food in the Last Year: Never true    Ran Out of Food in the Last Year: Never true   Transportation Needs: No Transportation Needs (10/23/2024)   Epic    Lack of Transportation (Medical): No    Lack of Transportation (Non-Medical): No  Physical Activity: Insufficiently Active (10/23/2024)   Exercise Vital Sign    Days of Exercise per Week: 2 days    Minutes of Exercise per Session: 30 min  Stress: Stress Concern Present (10/23/2024)   [employer] of Occupational Health - Occupational Stress Questionnaire    Feeling of Stress: To some extent  Social Connections: Socially Isolated (10/23/2024)   Social Connection and Isolation Panel    Frequency of Communication with Friends and Family: Once a week    Frequency of Social Gatherings with Friends and Family: Never    Attends Religious Services: Never    Database Administrator or Organizations: No    Attends Engineer, Structural: Not on file    Marital Status: Married  Depression (PHQ2-9): Medium Risk (10/27/2024)   Depression (PHQ2-9)    PHQ-2 Score: 8  Alcohol Screen: Low Risk (10/23/2024)   Alcohol Screen    Last Alcohol Screening Score (AUDIT): 2  Housing: Low Risk (10/23/2024)   Epic    Unable to Pay for Housing in the Last Year: No    Number of Times Moved in the Last Year: 0    Homeless in the Last Year: No  Utilities: Not At Risk (10/16/2022)   [facility] Utilities    Threatened with loss of utilities: No  Health Literacy: Not on file   Past Medical History:  Diagnosis Date   Allergy    Depression    Hyperlipidemia 2023   Migraine with aura    yellowish color seen   Postmenopausal bleeding    Past Surgical History:  Procedure Laterality Date   BREAST BIOPSY Left    benign - years ago   COLONOSCOPY  10/12/2022   normal   DILATATION & CURETTAGE/HYSTEROSCOPY WITH MYOSURE N/A 05/06/2016   Procedure: DILATATION & CURETTAGE/HYSTEROSCOPY WITH MYOSURE;  Surgeon: Kate Hargis Nearing, MD;  Location: [facility];  Service: Gynecology;  Laterality: N/A;   INDUCED ABORTION     LAPAROSCOPY N/A 05/06/2016    Procedure: LAPAROSCOPY OPERATIVE;  Surgeon: Kate Hargis Nearing, MD;  Location: [facility];  Service: Gynecology;  Laterality: N/A;  RNFA NEEDED    SALPINGOOPHORECTOMY Bilateral 05/06/2016   Procedure: Bilateral Salpingo oophorectomy;  Surgeon: Kate Hargis Nearing, MD;  Location: [facility];  Service:  Gynecology;  Laterality: Bilateral;   WISDOM TOOTH EXTRACTION Bilateral 10/30/2021    Family History  Problem Relation Age of Onset   Hypertension Mother    Osteoporosis Mother    Dementia Mother    Hypertension Father    CVA Father    Alcohol abuse Father    COPD Father    Stroke Father    Ovarian cancer Maternal Aunt    Breast cancer Other    Colon cancer Neg Hx    Colon polyps Neg Hx    Esophageal cancer Neg Hx    Rectal cancer Neg Hx    Stomach cancer Neg Hx     Review of Systems  Constitutional:  Negative for chills, fatigue and fever.  HENT:  Positive for ear pain (RIGHT EAR FULLNESS). Negative for congestion and sore throat.   Respiratory:  Negative for cough and shortness of breath.   Cardiovascular:  Negative for chest pain.  Gastrointestinal:  Negative for abdominal pain, constipation, diarrhea, nausea and vomiting.  Genitourinary:  Negative for dysuria and urgency.  Musculoskeletal:  Negative for arthralgias and myalgias.  Skin:  Negative for rash.  Neurological:  Positive for dizziness and headaches.  Psychiatric/Behavioral:  Positive for sleep disturbance (daytime somnolence.). Negative for dysphoric mood. The patient is not nervous/anxious.      Objective:  BP 122/64   Pulse 74   Temp 98 F (36.7 C)   Ht 5' 3 (1.6 m)   Wt 132 lb 8 oz (60.1 kg)   SpO2 97%   BMI 23.47 kg/m      10/27/2024    7:37 AM 10/21/2023    3:21 PM 10/16/2022    8:02 AM  BP/Weight  Systolic BP 122 110 90  Diastolic BP 64 60 50  Wt. (Lbs) 132.5 128 123  BMI 23.47 kg/m2 22.67 kg/m2 21.79 kg/m2    Physical Exam Vitals reviewed.  Constitutional:      General: She is not in acute  distress.    Appearance: Normal appearance. She is normal weight.  HENT:     Right Ear: Tympanic membrane and ear canal normal.     Left Ear: Tympanic membrane and ear canal normal.     Nose: Congestion present. No rhinorrhea.  Eyes:     Conjunctiva/sclera: Conjunctivae normal.  Neck:     Thyroid: No thyroid mass or thyromegaly.     Vascular: No carotid bruit.  Cardiovascular:     Rate and Rhythm: Normal rate and regular rhythm.     Pulses: Normal pulses.     Heart sounds: Normal heart sounds. No murmur heard. Pulmonary:     Effort: Pulmonary effort is normal.     Breath sounds: Normal breath sounds.  Abdominal:     General: Bowel sounds are normal.     Palpations: Abdomen is soft. There is no mass.     Tenderness: There is no abdominal tenderness.  Musculoskeletal:     Right lower leg: No edema.     Left lower leg: No edema.  Lymphadenopathy:     Cervical: No cervical adenopathy.  Skin:    General: Skin is warm and dry.  Neurological:     Mental Status: She is alert and oriented to person, place, and time.     Cranial Nerves: No cranial nerve deficit.  Psychiatric:        Mood and Affect: Mood normal.        Behavior: Behavior normal.     Lab Results  Component Value  Date   WBC 5.9 10/27/2024   HGB 13.6 10/27/2024   HCT 42.2 10/27/2024   PLT 209 10/27/2024   GLUCOSE 81 10/27/2024   CHOL 146 03/17/2024   TRIG 89 03/17/2024   HDL 55 03/17/2024   LDLCALC 74 03/17/2024   ALT 12 10/27/2024   AST 20 10/27/2024   NA 142 10/27/2024   K 4.1 10/27/2024   CL 101 10/27/2024   CREATININE 0.67 10/27/2024   BUN 24 10/27/2024   CO2 25 10/27/2024   TSH 2.390 10/27/2024      Assessment & Plan:   Assessment & Plan Encounter for general adult medical examination with abnormal findings Education given,     Mixed hyperlipidemia Restarted pravastatin .  Monitor for side effects and if myalgias recur, consider alternative statin. Recommend continue to work on eating  healthy diet and exercise.  Orders:   POCT Lipid Panel   CBC with Differential/Platelet   Comprehensive metabolic panel with GFR   TSH  Daytime somnolence Order sleep study  Orders:   Home sleep test; Future  Encounter for immunization  Orders:   Pneumococcal conjugate vaccine 20-valent     Body mass index is 23.47 kg/m.   These are the goals we discussed:  Goals   None      This is a list of the screening recommended for you and due dates:  Health Maintenance  Topic Date Due   HIV Screening  Never done   Hepatitis C Screening  Never done   Pap with HPV screening  07/05/2024   Breast Cancer Screening  08/24/2026   DTaP/Tdap/Td vaccine (3 - Td or Tdap) 10/14/2031   Colon Cancer Screening  10/12/2032   Pneumococcal Vaccine for age over 67  Completed   Flu Shot  Completed   COVID-19 Vaccine  Completed   Zoster (Shingles) Vaccine  Completed   Hepatitis B Vaccine  Aged Out   HPV Vaccine  Aged Out   Meningitis B Vaccine  Aged Out     Meds ordered this encounter  Medications   clotrimazole -betamethasone  (LOTRISONE ) cream    Sig: Apply 1 Application topically 2 (two) times daily.    Dispense:  45 g    Refill:  2    Follow-up: Return in about 3 months (around 01/25/2025) for lab visit - cmp and lipid panel.  An After Visit Summary was printed and given to the patient.  Abigail Free, MD [facility] [phone]  "

## 2024-10-27 NOTE — Patient Instructions (Addendum)
 Ordered home sleep study.  Restarted pravastatin .Please let me know if it is causing side effects and we will switch to another cholesterol medicine of your choice.  I have you coming back just for labs in 3 months. If you prefer a doctor visit, I am happy to see you. Just make an appointment with me instead of just a lab visit.  Happy New Year!   Shanora Christensen

## 2024-10-28 LAB — CBC WITH DIFFERENTIAL/PLATELET
Basophils Absolute: 0.1 x10E3/uL (ref 0.0–0.2)
Basos: 1 %
EOS (ABSOLUTE): 0 x10E3/uL (ref 0.0–0.4)
Eos: 1 %
Hematocrit: 42.2 % (ref 34.0–46.6)
Hemoglobin: 13.6 g/dL (ref 11.1–15.9)
Immature Grans (Abs): 0 x10E3/uL (ref 0.0–0.1)
Immature Granulocytes: 0 %
Lymphocytes Absolute: 1.7 x10E3/uL (ref 0.7–3.1)
Lymphs: 28 %
MCH: 31.9 pg (ref 26.6–33.0)
MCHC: 32.2 g/dL (ref 31.5–35.7)
MCV: 99 fL — ABNORMAL HIGH (ref 79–97)
Monocytes Absolute: 0.5 x10E3/uL (ref 0.1–0.9)
Monocytes: 8 %
Neutrophils Absolute: 3.7 x10E3/uL (ref 1.4–7.0)
Neutrophils: 62 %
Platelets: 209 x10E3/uL (ref 150–450)
RBC: 4.27 x10E6/uL (ref 3.77–5.28)
RDW: 12.2 % (ref 11.7–15.4)
WBC: 5.9 x10E3/uL (ref 3.4–10.8)

## 2024-10-28 LAB — COMPREHENSIVE METABOLIC PANEL WITH GFR
ALT: 12 IU/L (ref 0–32)
AST: 20 IU/L (ref 0–40)
Albumin: 4.8 g/dL (ref 3.9–4.9)
Alkaline Phosphatase: 68 IU/L (ref 49–135)
BUN/Creatinine Ratio: 36 — ABNORMAL HIGH (ref 12–28)
BUN: 24 mg/dL (ref 8–27)
Bilirubin Total: 0.4 mg/dL (ref 0.0–1.2)
CO2: 25 mmol/L (ref 20–29)
Calcium: 9.4 mg/dL (ref 8.7–10.3)
Chloride: 101 mmol/L (ref 96–106)
Creatinine, Ser: 0.67 mg/dL (ref 0.57–1.00)
Globulin, Total: 2.4 g/dL (ref 1.5–4.5)
Glucose: 81 mg/dL (ref 70–99)
Potassium: 4.1 mmol/L (ref 3.5–5.2)
Sodium: 142 mmol/L (ref 134–144)
Total Protein: 7.2 g/dL (ref 6.0–8.5)
eGFR: 98 mL/min/1.73

## 2024-10-28 LAB — TSH: TSH: 2.39 u[IU]/mL (ref 0.450–4.500)

## 2024-10-29 ENCOUNTER — Ambulatory Visit: Payer: Self-pay | Admitting: Family Medicine

## 2024-11-03 NOTE — Assessment & Plan Note (Signed)
 Restarted pravastatin .  Monitor for side effects and if myalgias recur, consider alternative statin. Recommend continue to work on eating healthy diet and exercise.  Orders:   POCT Lipid Panel   CBC with Differential/Platelet   Comprehensive metabolic panel with GFR   TSH

## 2024-11-03 NOTE — Assessment & Plan Note (Signed)

## 2025-01-26 ENCOUNTER — Other Ambulatory Visit

## 2025-03-12 ENCOUNTER — Encounter
